# Patient Record
Sex: Female | Born: 1980 | Race: Black or African American | Hispanic: No | Marital: Single | Smoking: Never smoker
Health system: Southern US, Community
[De-identification: ages and names within clinical notes are randomized; demographics above are authoritative.]

## PROBLEM LIST (undated history)

## (undated) DIAGNOSIS — G4710 Hypersomnia, unspecified: Secondary | ICD-10-CM

## (undated) DIAGNOSIS — G2581 Restless legs syndrome: Secondary | ICD-10-CM

## (undated) DIAGNOSIS — G47419 Narcolepsy without cataplexy: Secondary | ICD-10-CM

## (undated) HISTORY — DX: Narcolepsy without cataplexy: G47.419

## (undated) HISTORY — DX: Hypersomnia, unspecified: G47.10

## (undated) HISTORY — DX: Restless legs syndrome: G25.81

## (undated) HISTORY — PX: NO PAST SURGERIES: SHX2092

---
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
## 2002-06-21 ENCOUNTER — Emergency Department (HOSPITAL_COMMUNITY): Admission: EM | Admit: 2002-06-21 | Discharge: 2002-06-21 | Payer: Self-pay | Admitting: Emergency Medicine

## 2003-08-25 ENCOUNTER — Ambulatory Visit (HOSPITAL_COMMUNITY): Admission: RE | Admit: 2003-08-25 | Discharge: 2003-08-25 | Payer: Self-pay | Admitting: *Deleted

## 2003-10-16 ENCOUNTER — Inpatient Hospital Stay (HOSPITAL_COMMUNITY): Admission: AD | Admit: 2003-10-16 | Discharge: 2003-10-16 | Payer: Self-pay | Admitting: Family Medicine

## 2003-10-16 ENCOUNTER — Encounter: Payer: Self-pay | Admitting: Emergency Medicine

## 2004-01-26 ENCOUNTER — Ambulatory Visit: Payer: Self-pay | Admitting: Family Medicine

## 2004-01-30 ENCOUNTER — Ambulatory Visit: Payer: Self-pay | Admitting: Family Medicine

## 2004-01-30 ENCOUNTER — Inpatient Hospital Stay (HOSPITAL_COMMUNITY): Admission: AD | Admit: 2004-01-30 | Discharge: 2004-02-03 | Payer: Self-pay | Admitting: *Deleted

## 2004-01-30 ENCOUNTER — Ambulatory Visit: Payer: Self-pay | Admitting: *Deleted

## 2004-02-01 ENCOUNTER — Ambulatory Visit: Payer: Self-pay | Admitting: Obstetrics and Gynecology

## 2006-01-21 ENCOUNTER — Ambulatory Visit (HOSPITAL_BASED_OUTPATIENT_CLINIC_OR_DEPARTMENT_OTHER): Admission: RE | Admit: 2006-01-21 | Discharge: 2006-01-21 | Payer: Self-pay | Admitting: Internal Medicine

## 2006-01-21 ENCOUNTER — Encounter: Payer: Self-pay | Admitting: Pulmonary Disease

## 2006-01-25 ENCOUNTER — Ambulatory Visit: Payer: Self-pay | Admitting: Internal Medicine

## 2006-04-15 ENCOUNTER — Ambulatory Visit: Payer: Self-pay | Admitting: Pulmonary Disease

## 2007-04-20 ENCOUNTER — Emergency Department (HOSPITAL_COMMUNITY): Admission: EM | Admit: 2007-04-20 | Discharge: 2007-04-20 | Payer: Self-pay | Admitting: Family Medicine

## 2008-01-27 ENCOUNTER — Emergency Department (HOSPITAL_COMMUNITY): Admission: EM | Admit: 2008-01-27 | Discharge: 2008-01-27 | Payer: Self-pay | Admitting: Family Medicine

## 2008-03-26 ENCOUNTER — Emergency Department (HOSPITAL_COMMUNITY): Admission: EM | Admit: 2008-03-26 | Discharge: 2008-03-26 | Payer: Self-pay | Admitting: Emergency Medicine

## 2008-12-28 ENCOUNTER — Ambulatory Visit: Payer: Self-pay | Admitting: Pulmonary Disease

## 2008-12-28 DIAGNOSIS — G4710 Hypersomnia, unspecified: Secondary | ICD-10-CM | POA: Insufficient documentation

## 2009-01-02 ENCOUNTER — Telehealth (INDEPENDENT_AMBULATORY_CARE_PROVIDER_SITE_OTHER): Payer: Self-pay | Admitting: *Deleted

## 2009-01-08 ENCOUNTER — Telehealth: Payer: Self-pay | Admitting: Pulmonary Disease

## 2009-01-30 ENCOUNTER — Encounter: Payer: Self-pay | Admitting: Pulmonary Disease

## 2009-01-30 ENCOUNTER — Ambulatory Visit (HOSPITAL_BASED_OUTPATIENT_CLINIC_OR_DEPARTMENT_OTHER): Admission: RE | Admit: 2009-01-30 | Discharge: 2009-01-30 | Payer: Self-pay | Admitting: Pulmonary Disease

## 2009-02-14 ENCOUNTER — Ambulatory Visit: Payer: Self-pay | Admitting: Pulmonary Disease

## 2009-02-14 ENCOUNTER — Telehealth (INDEPENDENT_AMBULATORY_CARE_PROVIDER_SITE_OTHER): Payer: Self-pay | Admitting: *Deleted

## 2009-02-15 ENCOUNTER — Ambulatory Visit: Payer: Self-pay | Admitting: Pulmonary Disease

## 2009-02-15 DIAGNOSIS — G47419 Narcolepsy without cataplexy: Secondary | ICD-10-CM | POA: Insufficient documentation

## 2009-02-15 DIAGNOSIS — G2581 Restless legs syndrome: Secondary | ICD-10-CM

## 2009-02-28 ENCOUNTER — Telehealth: Payer: Self-pay | Admitting: Pulmonary Disease

## 2009-03-01 ENCOUNTER — Telehealth: Payer: Self-pay | Admitting: Pulmonary Disease

## 2009-03-13 ENCOUNTER — Ambulatory Visit: Payer: Self-pay | Admitting: Pulmonary Disease

## 2009-05-01 ENCOUNTER — Ambulatory Visit: Payer: Self-pay | Admitting: Pulmonary Disease

## 2009-08-21 ENCOUNTER — Telehealth (INDEPENDENT_AMBULATORY_CARE_PROVIDER_SITE_OTHER): Payer: Self-pay | Admitting: *Deleted

## 2009-08-22 ENCOUNTER — Encounter: Payer: Self-pay | Admitting: Pulmonary Disease

## 2009-08-30 ENCOUNTER — Encounter: Payer: Self-pay | Admitting: Pulmonary Disease

## 2009-09-12 ENCOUNTER — Ambulatory Visit: Payer: Self-pay | Admitting: Pulmonary Disease

## 2009-09-14 ENCOUNTER — Telehealth (INDEPENDENT_AMBULATORY_CARE_PROVIDER_SITE_OTHER): Payer: Self-pay | Admitting: *Deleted

## 2009-09-24 ENCOUNTER — Encounter: Payer: Self-pay | Admitting: Pulmonary Disease

## 2010-02-05 NOTE — Progress Notes (Signed)
Summary: rx side effects     Phone Note Call from Patient Call back at [facility] Phone [phone]   Caller: Patient Call For: Theresa Foster Summary of Call: pt has cut her rx of ropinirole in 1/2 and she is still vomiting. (see previous emr msg). pt wants a rx for nausea. pt also states that she is having her wisdom teeth extracted tomorrow. she will be on pain meds and wants to know if there will be any problems re: taking all her meds.  Initial call taken by: Tivis Ringer,  January 08, 2009 9:52 AM  Follow-up for Phone Call        Pt has cut requip in half and she is still vomiting so she is requesting something for nausea. Also, the pt is scheduled to have her wisdom teeth extracted and she will be taking pain medicatio and she wants to make sure this will not interfere with any of her other medications. Please advise. Allergies: NKDA.   Carron Curie CMA  January 08, 2009 1:57 PM   Additional Follow-up for Phone Call Additional follow up Details #1::        If ropinerole is causing nausea, she should stop it until she can discuss with Dr Shelle Iron.     Additional Follow-up by: Waymon Budge MD,  January 08, 2009 5:49 PM    Additional Follow-up for Phone Call Additional follow up Details #2::    agree with discontinuing requip completely.  With everything going on, we should reschedule her MSLT study for later in the month...we really need to have an accurate study when this is done.  will send an order to schedule for 2 weeks later...someone will call her....make sure she fills out sleep diaries 2 weeks prior to the study Follow-up by: Barbaraann Share MD,  January 08, 2009 5:57 PM  Additional Follow-up for Phone Call Additional follow up Details #3:: Details for Additional Follow-up Action Taken: [facility]  Arman Filter LPN  January 09, 2009 9:58 AM  pt advised to stop requip, and to do sleep diary 2 weeks prior to sleep study. Study set fo r1/25/10. pt aware. Carron Curie CMA   January 10, 2009 10:34 AM

## 2010-02-05 NOTE — Progress Notes (Signed)
Summary: rx  Phone Note Call from Patient Call back at Home Phone [phone]   Caller: Patient Call For: Gigi Onstad Reason for Call: Talk to Nurse Summary of Call: pharmacy called pt and said insurance said no to her Nuvigil 250mg .  Please advise Initial call taken by: Eugene Gavia,  March 01, 2009 10:28 AM  Follow-up for Phone Call        called  medicaid PA line at [phone] and received PA approval on nuvigil 250mg  once daily though 03-01-2010. I advised pharmacy of this. pt aware as well. Carron Curie CMA  March 01, 2009 11:17 AM

## 2010-02-05 NOTE — Assessment & Plan Note (Signed)
Summary: rov for narcolepsy, RLS   Primary Provider/Referring Provider:  Avbuere  CC:  Pt is here for a f/u appt.  Pt states her sleep is "ok" while on Mirapex.  Pt would like to discuss dosage of Nuvigil.  States its too much- feels heart racing during the daytime even at rest. .  History of Present Illness: the pt comes in today for f/u of her known narcolepsy and RLS.  She is taking mirapex with good control of her leg symptoms.  She is taking nuvigil for her daytime sleepiness, and although it keeps her functional, she feels she did just as well on the lower dose without side effects.  She would like to go back to lower dose.  She denies poor sleep hygiene.  Current Medications (verified): 1)  Mirapex 0.125 Mg  Tabs (Pramipexole Dihydrochloride) .... Take 1 Tab By Mouth At Bedtime 2)  Nuvigil 250 Mg Tabs (Armodafinil) .... Take 1 By Mouth Once Daily  Allergies (verified): No Known Drug Allergies  Review of Systems       The patient complains of shortness of breath at rest, non-productive cough, nasal congestion/difficulty breathing through nose, and sneezing.  The patient denies shortness of breath with activity, productive cough, coughing up blood, chest pain, irregular heartbeats, acid heartburn, indigestion, loss of appetite, weight change, abdominal pain, difficulty swallowing, sore throat, tooth/dental problems, headaches, itching, ear ache, anxiety, depression, hand/feet swelling, joint stiffness or pain, rash, change in color of mucus, and fever.    Vital Signs:  Patient profile:   30 year old female Height:      64 inches Weight:      134 pounds BMI:     23.08 O2 Sat:      100 % on Room air Temp:     99.4 degrees F oral Pulse rate:   90 / minute BP sitting:   120 / 80  (left arm) Cuff size:   regular  Vitals Entered By: Arman Filter LPN (September 12, 2009 9:00 AM)  O2 Flow:  Room air CC: Pt is here for a f/u appt.  Pt states her sleep is "ok" while on Mirapex.  Pt  would like to discuss dosage of Nuvigil.  States its too much- feels heart racing during the daytime even at rest.  Comments Medications reviewed with patient Arman Filter LPN  September 12, 2009 9:03 AM    Physical Exam  General:  wd female in nad Nose:  no obvious purulence or discharge. Extremities:  no edema or cyanosis  Neurologic:  alert, does not appear sleepy, moves all 4.   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  Will go ahead and change her prescription.  I have again stressed to her the need to get 8hrs of sleep a night, and to take naps and/or modest intake of caffeine during the day.  Problem # 2:  RESTLESS LEGS SYNDROME (ICD-333.94) she is to continue on her dopamine agonist  Medications Added to Medication List This Visit: 1)  Mirapex 0.125 Mg Tabs (Pramipexole dihydrochloride) .... Take 1 tab by mouth at bedtime 2)  Nuvigil 150 Mg Tabs (Armodafinil) .... One each am, and can take additional dose at 1pm if needed.  Other Orders: Est. Patient Level III (57846)  Patient Instructions: 1)  continue on mirapex 2)  continue working on good sleep hygiene, naps during  the day as needed 3)  will change nuvigil to the 150mg  dose each am, and can take an extra dose at 1pm if needed for busy days. 4)  followup with me in 6mos  Prescriptions: NUVIGIL 150 MG TABS (ARMODAFINIL) one each am, and can take additional dose at 1pm if needed.  #60 x 6   Entered and Authorized by:   Barbaraann Share MD   Signed by:   Barbaraann Share MD on 09/12/2009   Method used:   Print then Give to Patient   RxID:   [id number]

## 2010-02-05 NOTE — Progress Notes (Signed)
Summary: Prior Auth - Nuvigil 150mg ---APPROVED  Phone Note Outgoing Call   Call placed by: Gweneth Dimitri RN,  September 14, 2009 10:17 AM Summary of Call: Received request from [facility] to initiate prior auth for Nuvigil 150mg .  Called [facility] to initiate this.  Awaiting fax to triage fax #.   Initial call taken by: Gweneth Dimitri RN,  September 14, 2009 10:19 AM  Follow-up for Phone Call        Fax received and placed in [facility] very important folder.  Gweneth Dimitri RN  September 14, 2009 10:26 AM  PA form still in [facility] lookat, not filled out.  form placed on top of stack.  [facility]'s nurse is aware and phone note sent to Megan's inbox. Boone Master CNA/MA  September 19, 2009 4:03 PM    Kc, just forwarding this message to you as an FYI.  The PA is in your very important look at folder for you to fill out.  Arman Filter LPN  September 20, 2009 2:46 PM    Additional Follow-up for Phone Call Additional follow up Details #1::        Form completed by [facility] and faxed back. Gweneth Dimitri RN  September 20, 2009 5:43 PM  APPROVED from 09/14/2009 ro 09/14/2010.Marland Kitchen Form will be given to [place] or Shawna Orleans to scan and pharmacy and pt will be notified.Michel Bickers [facility]  September 24, 2009 9:35 AM

## 2010-02-05 NOTE — Assessment & Plan Note (Signed)
Summary: rov for narcolepsy and rls   CC:  4 wk follow up.  states the nuvigil 250mg  qd is working.  states it does keep her awake during the day until about 4pm and then she will start to get tired.  States she does believe the 250mg  is working better than the 150mg .   Would like to discuss side effects of nuvigil-had read that depresion is a side effect and has concerns regarding this. .  History of Present Illness: the pt comes in today for f/u of her narcolepsy and RLS.  She has done better since starting on nuvigil and mirapex.  She is much more functional during the day, and her leg movements are improved as well.  She is trying to stick to good sleep hygiene.  Current Medications (verified): 1)  Birth Control Pill .... Take As Directed 2)  Mirapex 0.125 Mg  Tabs (Pramipexole Dihydrochloride) .... One By Mouth At Bedtime For First Week, Then Increase To 2 At [facility] If Tolerating. 3)  Nuvigil 250 Mg Tabs (Armodafinil) .... Take 1 Tablet By Mouth Once A Day  Allergies (verified): No Known Drug Allergies  Review of Systems      See HPI  Vital Signs:  Patient profile:   30 year old female Height:      64 inches Weight:      139.25 pounds BMI:     23.99 O2 Sat:      100 % on Room air Temp:     98.4 degrees F oral Pulse rate:   110 / minute BP sitting:   124 / 80  (right arm) Cuff size:   regular  Vitals Entered By: Gweneth Dimitri RN (March 13, 2009 9:07 AM)  O2 Flow:  Room air CC: 4 wk follow up.  states the nuvigil 250mg  qd is working.  states it does keep her awake during the day until about 4pm and then she will start to get tired.  States she does believe the 250mg  is working better than the 150mg .   Would like to discuss side effects of nuvigil-had read that depresion is a side effect and has concerns regarding this.  Comments Medications reviewed with patient Daytime contact number verified with patient. 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  She is able to function during the day, and has not had any side effects from the medication.  I have reminded her to continue to work on getting a good nights sleep, use of naps and caffeine.  Time spent with pt today was .  Problem # 2:  RESTLESS LEGS SYNDROME (ICD-333.94)  the pt is doing much better with mirapex.  She has missed a few doses, and clearly sees a difference when she takes it.  Other Orders: Est. Patient Level III (06237)  Patient Instructions: 1)  continue on nuvigil and mirapex 2)  continue to work on good sleep habits, naps when able, and caffeine in moderate consumption to help with symptoms. 3)  followup with me in 6mos.

## 2010-02-05 NOTE — Assessment & Plan Note (Signed)
Summary: ov to discuss mslt results.   CC:  Pt is here for a f/u appt to discuss MSLT results. .  History of Present Illness: The pt comes in today for f/u of her sleeping issues.  She was not able to tolerate requip due to nausea, but did notice that it helped her leg movements/sensations.  She has had her mslt, which was very abnormal.  It showed a mean sleep latency of 1.29min, and achieved REM in all the naps.  She did not bring sleep logs with her, but she tells me that she has made a point to get to bed nightly by 10pm, and feels she gets at least 7hrs of sleep a night.  I have gone over her study with her in detail, and answered all of her questions.  Current Medications (verified): 1)  Birth Control Pill .... Take As Directed  Allergies (verified): No Known Drug Allergies  Vital Signs:  Patient profile:   30 year old female Height:      64 inches Weight:      140.13 pounds BMI:     24.14 O2 Sat:      99 % on Room air Temp:     98.2 degrees F oral Pulse rate:   83 / minute BP sitting:   122 / 80  (left arm) Cuff size:   regular  Vitals Entered By: Arman Filter LPN (February 15, 2009 11:29 AM)  O2 Flow:  Room air CC: Pt is here for a f/u appt to discuss MSLT results.  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  I have had a long discussion with her about narcolepsy, and how we can never totally eliminate her daytime sleepiness.  Treatment will consist of addressing nocturnal issues such as RLS, working on good sleep hygeine, the use of naps during the day if able, use of caffeine in reasonable quantities, and finally the use of stimulant medication.  I would like  to start with nuvigil, and reminded her of his interaction with BCP's.  Time spent with pt today was .  Problem # 2:  RESTLESS LEGS SYNDROME (ICD-333.94) I do believe she has this, and will change requip to mirapex to see if she tolerates better.  Medications Added to Medication List This Visit: 1)  Mirapex 0.125 Mg Tabs (Pramipexole dihydrochloride) .... One by mouth at bedtime for first week, then increase to 2 at hs if tolerating. 2)  Nuvigil 150 Mg Tabs (Armodafinil) .... One each am, and may repeat dose in afternoon if needed. 3)  Nuvigil 150 Mg Tabs (Armodafinil) .... One each am, and can repeat in afternoon if needed.  Other Orders: Est. Patient Level IV (16109)  Patient Instructions: 1)  will change requip to mirapex 0.125mg  one after dinner each night.  If tolerating, increase to 2 after dinner each night 2)  start nuvigil 150mg  each am, and can take another dose at 1pm if having symptoms.  Do not forget the drug can interfere with birth control pills 3)  get at least 7 hours of sleep a night 4)  catnaps during day, moderate caffeine use to help with daytime symptoms. 5)  followup with me in 4 weeks.   Prescriptions: NUVIGIL 150 MG TABS (ARMODAFINIL) one each am, and can repeat in afternoon if needed.  #14 x 0   Entered and Authorized by:   Barbaraann Share MD   Signed by:   Barbaraann Share MD on 02/15/2009   Method used:   Print then Give to Patient   RxID:   [id number] NUVIGIL 150 MG TABS (ARMODAFINIL) one each am, and may repeat dose in afternoon if needed.  #60 x 6   Entered and Authorized by:   Barbaraann Share MD   Signed by:   Barbaraann Share MD on 02/15/2009   Method used:   Print then Give to Patient   RxID:   [id number] MIRAPEX 0.125 MG  TABS (PRAMIPEXOLE DIHYDROCHLORIDE) one By mouth at bedtime for first week, then increase to 2 at HS if tolerating.  #60 x 6   Entered and Authorized by:   Barbaraann Share MD   Signed by:   Barbaraann Share MD on  02/15/2009   Method used:   Print then Give to Patient   RxID:   [id number]    Immunization History:  Influenza Immunization History:    Influenza:  n/a (02/15/2009)  Pneumovax Immunization History:    Pneumovax:  n/a (02/15/2009)

## 2010-02-05 NOTE — Progress Notes (Signed)
----   Converted from flag ---- ---- 02/14/2009 9:37 AM, Arman Filter LPN wrote: pt needs ov with kc to discuss sleep study results. ------------------------------  called and spoke with pt.  pt scheduled to see kc tomorrow 02-15-2009 at 11:45am

## 2010-02-05 NOTE — Assessment & Plan Note (Signed)
Summary: rov for narcolepsy and RLS   Primary Provider/Referring Provider:  Avbuere  CC:  Narcolepsy and RLS follow-up.  The patient says the Mirapex is working well for her but she stopped the Nuvigil 3-4 days ago due to rapid heart rate.Marland Kitchen  History of Present Illness: the pt comes in today for f/u of her RLS and narcolepsy.  She has been doing well with mirapex, and feels it helps quite a bit.  However, she has been having palpitations, and thinks it is related to her nuvigil.  She has since stopped this med, and now has significant daytime sleepiness.  Current Medications (verified): 1)  Birth Control Pill .... Take As Directed 2)  Mirapex 0.125 Mg  Tabs (Pramipexole Dihydrochloride) .... One By Mouth At Bedtime For First Week, Then Increase To 2 At [facility] If Tolerating.  Allergies (verified): No Known Drug Allergies  Review of Systems      See HPI  Vital Signs:  Patient profile:   30 year old female Height:      64 inches (162.56 cm) Weight:      137 pounds (62.27 kg) BMI:     23.60 O2 Sat:      100 % on Room air Temp:     98.5 degrees F (36.94 degrees C) oral Pulse rate:   104 / minute BP sitting:   118 / 76  (left arm) Cuff size:   regular  Vitals Entered By: Michel Bickers CMA (May 01, 2009 8:55 AM)  O2 Sat at Rest %:  100 O2 Flow:  Room air CC: Narcolepsy and RLS follow-up.  The patient says the Mirapex is working well for her but she stopped the Nuvigil 3-4 days ago due to rapid heart rate. Comments Medications reviewed with the patient. Daytime phone number verified. Michel Bickers CMA  May 01, 2009 8:57 AM   Physical Exam  General:  wd female in nad Neurologic:  awake, does not appear sleepy moves all 4.   Impression & Recommendations:  Problem # 1:  RESTLESS LEGS SYNDROME (ICD-333.94)  doing much better on mirapex, with no disruption of sleep from leg jerks or abnormal sensations.  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  I have explained to pt that nuvigil has been studied extensively, and has no significant effects on the CV system.  I have asked her not to assume this is the cause of her palpitations, and to make an apptm with her primary md to look at other causes.  I have also explained to her the other options for treatment have extensive cardiovascular risks.  Time spent with pt today was  Other Orders: Est. Patient Level III (11914)  Patient Instructions: 1)  stay on mirapex 2)  would get back on nuvigil, and make apptm with your primary md to look at other causes of palpitations. 3)  follow up with me in 6mos.

## 2010-02-05 NOTE — Progress Notes (Signed)
Summary: nuvigil  Phone Note Call from Patient Call back at [facility] Phone [phone]   Caller: Patient Call For: [facility] Summary of Call: PT NEEDS RX FOR NUVIGIL BUT WAS TOLD THAT SHE NEEDS PREAUTH (DUE TO MEDICAID COVERAGE). IN THE MEANTIME, PT ONLY HAS 1 WEEKS WORTH OF NUVIGIL SAMPLE LEFT.    Initial call taken by: Tivis Ringer, CNA,  February 28, 2009 3:23 PM  Follow-up for Phone Call        Called to get PA.  Was informed that we can not approve med because directions say to take 1 in the am and 1 in the afternoon if needed.  They can approve the 250 mg once daily or the 150 mg strength to be taken only once a day.  Please advise, thanks Vernie Murders  February 28, 2009 3:41 PM      Additional Follow-up for Phone Call Additional follow up Details #1::        let pt know this info....would use 250 mg once daily and see how things go. Additional Follow-up by: Barbaraann Share MD,  February 28, 2009 5:26 PM    Additional Follow-up for Phone Call Additional follow up Details #2::    pt states she will try 250mg  once daily. I have sent rx to pharmacy.Carron Curie CMA  March 01, 2009 9:26 AM   New/Updated Medications: NUVIGIL 250 MG TABS (ARMODAFINIL) Take 1 tablet by mouth once a day Prescriptions: NUVIGIL 250 MG TABS (ARMODAFINIL) Take 1 tablet by mouth once a day  #30 x 2   Entered by:   Carron Curie CMA   Authorized by:   Barbaraann Share MD   Signed by:   Carron Curie CMA on 03/01/2009   Method used:   Telephoned to ...       [facility] [address] [phone]* (retail)       [address]       [place], [place]  [address]       Ph: [phone]       Fax: [phone]   RxID:   [id number]

## 2010-02-05 NOTE — Letter (Signed)
Summary: RELEASE OF INFO  RELEASE OF INFO   Imported By: Lehman Prom 08/30/2009 14:18:04  _____________________________________________________________________  External Attachment:    Type:   Image     Comment:   External Document

## 2010-02-05 NOTE — Progress Notes (Signed)
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 fax# [phone].  Initial call taken by: Tivis Ringer, CNA,  August 21, 2009 11:07 AM  Follow-up for Phone Call        called and spoke with pt.  pt states she needs a letter for her school from Dr. Shelle Iron stating what he treats her for.  pt states she needs this letter today.  Pt states she will come back today when letter is ready to be picked up and will sign a release of info at that time allowing [place] to give this medical information to her school, [employer].  Arman Filter LPN  August 21, 2009 11:19 AM   pt called back again today requesting note.  i informed pt I took the message and gave to Dr. Shelle Iron.  However, our protocol is to allow 24 to 48 hours for a letter.  pt got very upset stating she needed letter asap.  i informed pt Dr. Shelle Iron is out of the office this afternoon and will return to the office tomorrow.  I instructed pt to wait until we call her letting her know letter is ready before coming to the office to pick up.  pt verbalized understanding.  Arman Filter LPN  August 21, 2009 2:44 PM   Additional Follow-up for Phone Call Additional follow up Details #1::        megan, I really need to know what the letter is for, and what it is to address. i.e.  why does she need it. Additional Follow-up by: Barbaraann Share MD,  August 21, 2009 3:09 PM    Additional Follow-up for Phone Call Additional follow up Details #2::    Spoke with patient-she states the letter needs to say she is a patient of yours under treatment for her conditions. Also, pt stated she is currently taking Nuvigil 250mg  once daily and has been on this since the spring-I update her med list to reflect  this. Thanks. Reynaldo Minium CMA  August 21, 2009 3:17 PM   Additional Follow-up for Phone Call Additional follow up Details #3:: Details for Additional Follow-up Action Taken: please make sure this pt knows that it interferes with BCP's, and should consider other contraceptive methods if she is taking bcp's and doesn't want to risk pregnancy.  let her know that Aundra Millet has her letter. Additional Follow-up by: Barbaraann Share MD,  August 22, 2009 5:39 PM  New/Updated Medications: NUVIGIL 250 MG TABS (ARMODAFINIL) take 1 by mouth once daily   Pt is aware that letter is at front for pick up-will speak to her about the BCP issues when she gets letter.Reynaldo Minium CMA  August 23, 2009 9:17 AM    Pt picked up letter and is aware of BCP and Nuvigil recs from [facility].Reynaldo Minium CMA  August 23, 2009 9:50 AM

## 2010-02-05 NOTE — Letter (Signed)
Summary: School letter/[employer]  School letter/[employer]   Imported By: Lester Flowing [place] 08/29/2009 09:58:23  _____________________________________________________________________  External Attachment:    Type:   Image     Comment:   External Document

## 2010-02-05 NOTE — Medication Information (Signed)
Summary: Tax adviser   Imported By: Valinda Hoar 09/24/2009 11:06:52  _____________________________________________________________________  External Attachment:    Type:   Image     Comment:   External Document

## 2010-03-11 ENCOUNTER — Ambulatory Visit (INDEPENDENT_AMBULATORY_CARE_PROVIDER_SITE_OTHER): Payer: Medicaid Other | Admitting: Pulmonary Disease

## 2010-03-11 ENCOUNTER — Encounter: Payer: Self-pay | Admitting: Pulmonary Disease

## 2010-03-11 DIAGNOSIS — G47419 Narcolepsy without cataplexy: Secondary | ICD-10-CM

## 2010-03-11 DIAGNOSIS — G2581 Restless legs syndrome: Secondary | ICD-10-CM

## 2010-03-19 NOTE — Assessment & Plan Note (Signed)
Summary: rov for narcolepsy/RLS   Primary Provider/Referring Provider:  Concepcion Elk  CC:  6 month f/u appt for RLS and Narcolepsy.  Pt denied any complaints with RLS and being on Mirapex.  Pt denied any complaints with staying awake during the day. States she will occ have to take a second dose of Nuvigil if she knows she is going to have a busy day.  Marland Kitchen  History of Present Illness: the pt comes in today for f/u of her RLS and narcolepsy.  She is maintaining on a dopamine agonist and nuvigil, and feels she is doing well.  She is sticking to good sleep hygiene, and behaviors to minimize her sleepiness.    Medications Prior to Update: 1)  Mirapex 0.125 Mg  Tabs (Pramipexole Dihydrochloride) .... Take 1 Tab By Mouth At Bedtime 2)  Nuvigil 150 Mg Tabs (Armodafinil) .... One Each Am, and Can Take Additional Dose At 1pm If Needed.  Allergies (verified): No Known Drug Allergies  Review of Systems       The patient complains of shortness of breath with activity, shortness of breath at rest, productive cough, non-productive cough, chest pain, irregular heartbeats, sore throat, and headaches.  The patient denies coughing up blood, acid heartburn, indigestion, loss of appetite, weight change, abdominal pain, difficulty swallowing, tooth/dental problems, nasal congestion/difficulty breathing through nose, sneezing, itching, ear ache, anxiety, depression, hand/feet swelling, joint stiffness or pain, rash, change in color of mucus, and fever.    Vital Signs:  Patient profile:   30 year old female Height:      64 inches Weight:      142.50 pounds O2 Sat:      99 % on Room air Temp:     98.3 degrees F oral Pulse rate:   99 / minute BP sitting:   120 / 70  (left arm) Cuff size:   regular  O2 Flow:  Room air CC: 6 month f/u appt for RLS and Narcolepsy.  Pt denied any complaints with RLS and being on Mirapex.  Pt denied any complaints with staying awake during the day. States she will occ have to take a  second dose of Nuvigil if she knows she is going to have a busy day.     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  She is getting rest at night, and is working on taking short naps during day when able.    Problem # 2:  RESTLESS LEGS SYNDROME (ICD-333.94)  doing well with requip.  Other Orders: Est. Patient Level III (16109)  Patient Instructions: 1)  no change in meds 2)  continue to work on getting good sleep 3)  followup with me in 6mos

## 2010-04-22 LAB — WET PREP, GENITAL
Trich, Wet Prep: NONE SEEN
WBC, Wet Prep HPF POC: NONE SEEN
Yeast Wet Prep HPF POC: NONE SEEN

## 2010-04-22 LAB — POCT URINALYSIS DIP (DEVICE)
Bilirubin Urine: NEGATIVE
Hgb urine dipstick: NEGATIVE
Ketones, ur: NEGATIVE mg/dL
Specific Gravity, Urine: 1.02 (ref 1.005–1.030)
pH: 7 (ref 5.0–8.0)

## 2010-04-22 LAB — URINE CULTURE

## 2010-04-25 ENCOUNTER — Other Ambulatory Visit: Payer: Self-pay | Admitting: Pulmonary Disease

## 2010-05-01 ENCOUNTER — Other Ambulatory Visit: Payer: Self-pay | Admitting: *Deleted

## 2010-05-01 MED ORDER — PRAMIPEXOLE DIHYDROCHLORIDE 0.125 MG PO TABS: ORAL_TABLET | ORAL | Status: DC

## 2010-05-01 MED ORDER — ARMODAFINIL 150 MG PO TABS: ORAL_TABLET | ORAL | Status: DC

## 2010-05-16 IMAGING — CT CT MAXILLOFACIAL W/O CM
3 series · 16 of 47 positions shown, 19 images · non-contrast
Comparison: None.

CLINICAL DATA: Trauma, left maxilla and nasal pain

CT MAXILLOFACIAL WITHOUT CONTRAST
TECHNIQUE: Multidetector CT imaging of the maxillofacial
structures was performed. Multiplanar CT image reconstructions were
also generated.

[Series 4: orbit/facial 2.0 h30s st · axial · 0.31mm/px · z∈[-226,-84]mm · 10 of 83 slices shown, 13 images]
[im 6/83  brain]
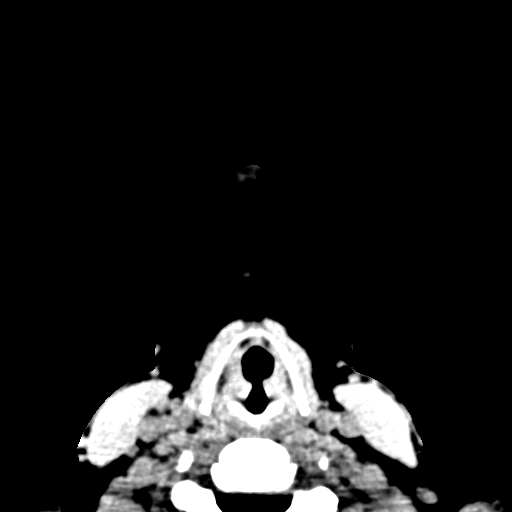
[im 6/83  bone]
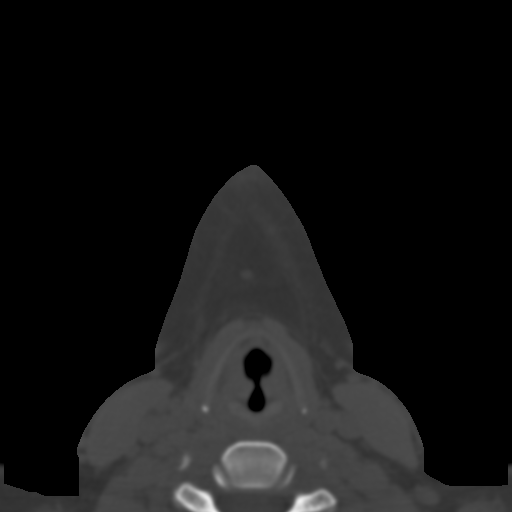
[im 15/83  bone]
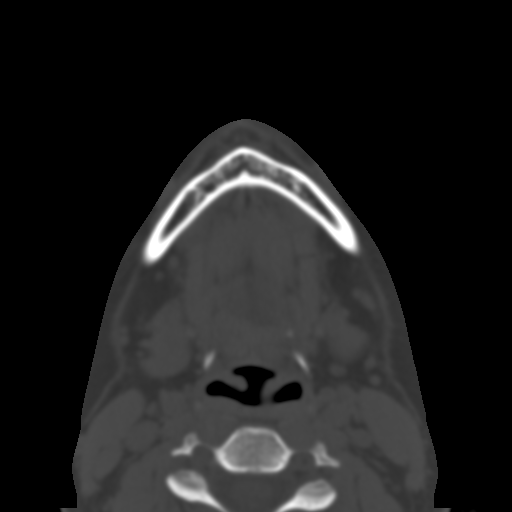
[im 23/83  bone]
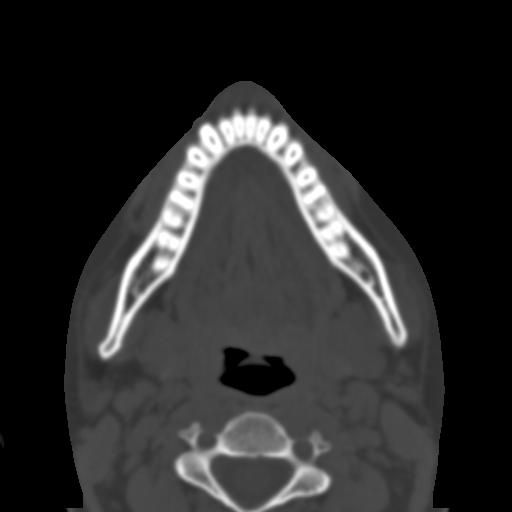
[im 29/83  bone]
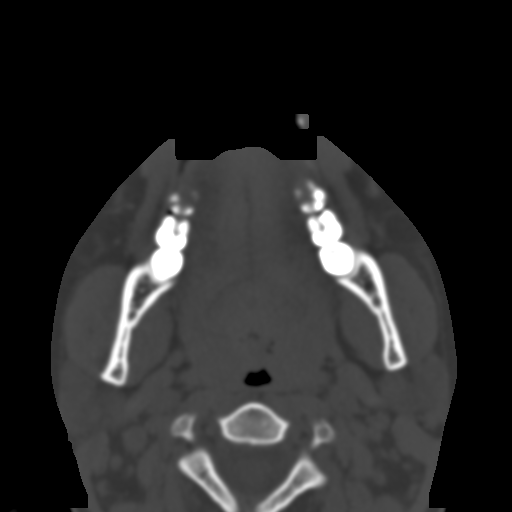
[im 37/83  brain]
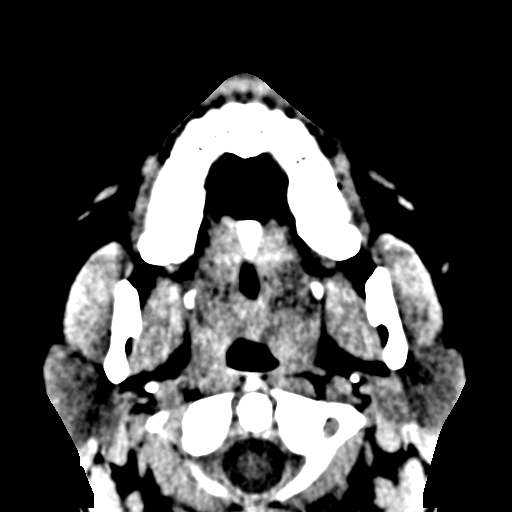
[im 37/83  bone]
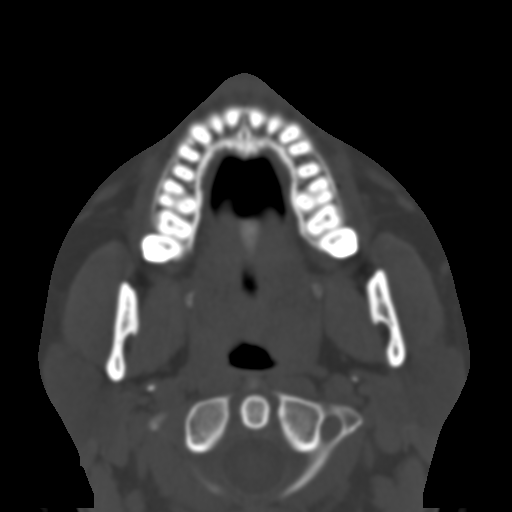
[im 46/83  bone]
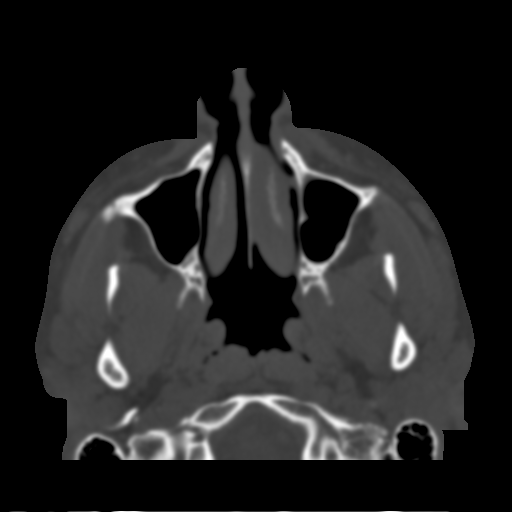
[im 54/83  bone]
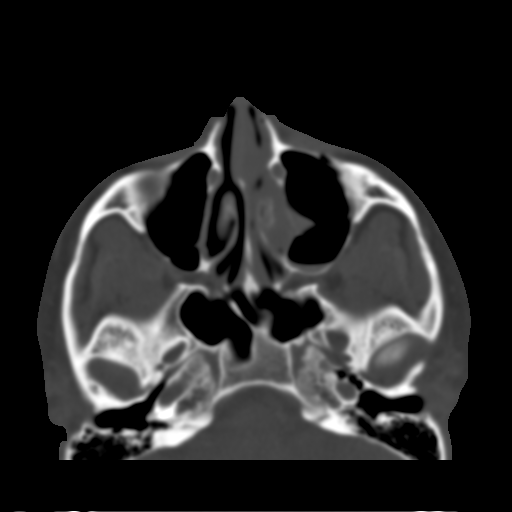
[im 63/83  bone]
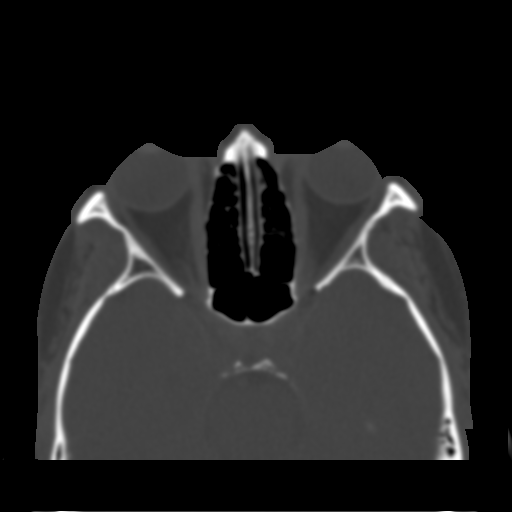
[im 68/83  brain]
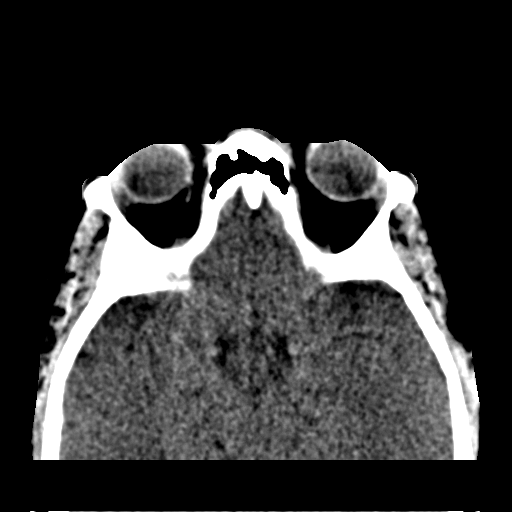
[im 68/83  bone]
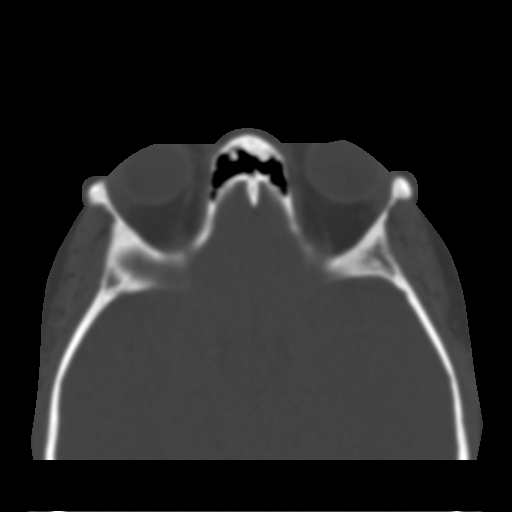
[im 77/83  bone]
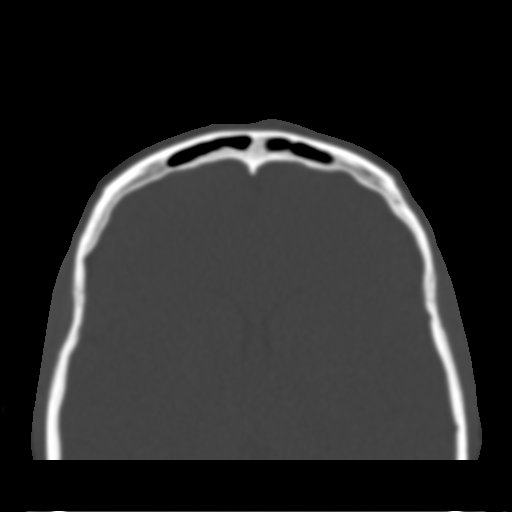

[Series 602: cor · coronal · 0.32mm/px · 3 of 66 slices shown]
[im 22/66  bone]
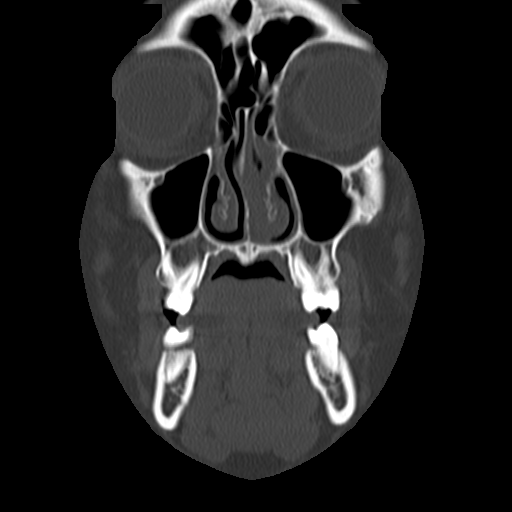
[im 29/66  bone]
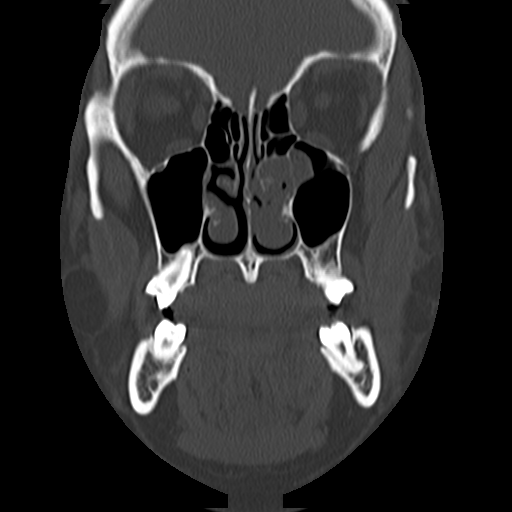
[im 37/66  bone]
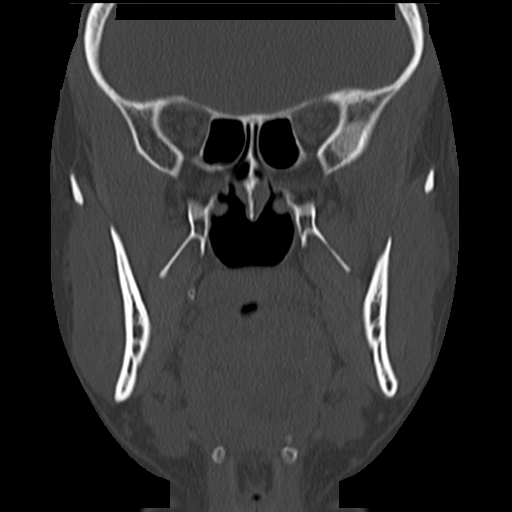

[Series 603: sag · sagittal · 0.32mm/px · 3 of 66 slices shown]
[im 22/66  bone]
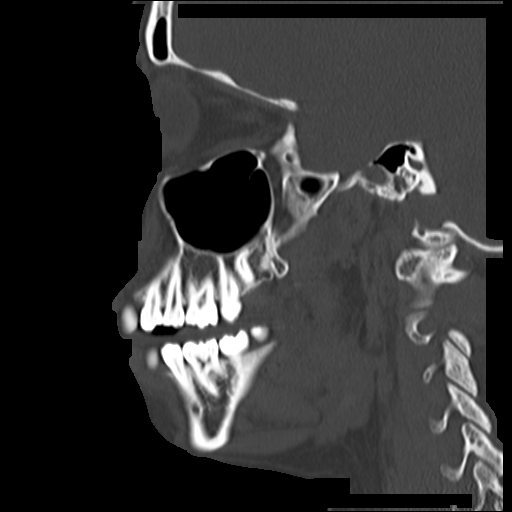
[im 33/66  bone]
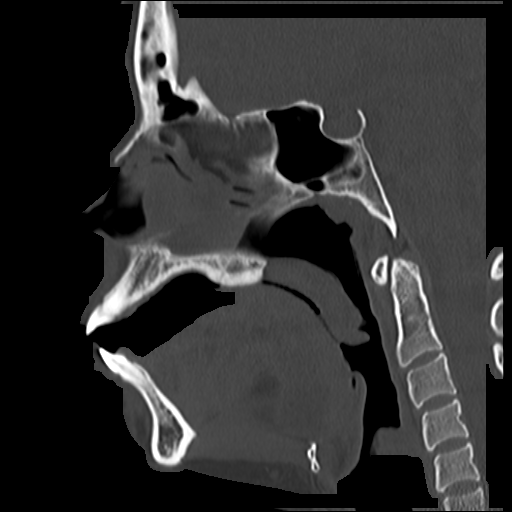
[im 44/66  bone]
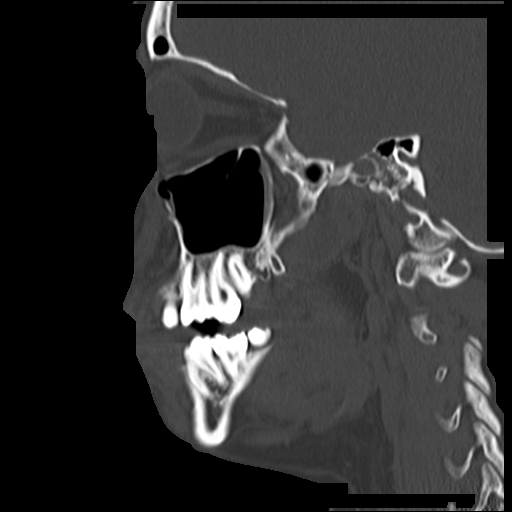

[16 of 47 positions shown; findings below may reference images not displayed]

FINDINGS: Minimally displaced fractures are present of the left
maxilla involving the medial wall and also the inferior orbital
wall.  Also suspected nondisplaced fracture of the left nasal
bones.  Hemorrhage is present in the left maxillary sinus with an
air-fluid level.  Mucosal thickening is present in the ethmoid
sinuses worse on the left.  No evidence of extraocular muscle
entrapment.  No proptosis.  Symmetric orbits.  No other fractures
visualized.  The mandible, right maxilla, zygomas, right orbit,
pterygoid plates, and hard palate appear intact.  Visualized
cervical spine unremarkable.
IMPRESSION: Left maxilla fracture involving the medial maxillary wall and
inferior orbital wall.
Suspect associated left nasal bone fracture.

## 2010-05-24 NOTE — Assessment & Plan Note (Signed)
[facility]                             PULMONARY OFFICE NOTE   NAME:Theresa Foster, Theresa Foster                        MRN:          [id number]  DATE:04/15/2006                            DOB:          1980-10-10    HISTORY OF PRESENT ILLNESS:  The patient is a 30 year old black female  who I have been asked to see for an unknown sleep disorder.  The patient  notes significant daytime sleepiness which started during her high  school years and has continued to be a problem.  She states back in high  school she would have sleepiness in the class to the point of dosing and  would also take naps frequently in the afternoon and evenings.  This all  occurred in the setting of excellent sleep hygiene.  The patient  recently was pregnancy and since that time, her symptoms have  accelerated.  She has very severe inappropriate daytime sleepiness and  states that she can fall asleep anytime she sits down.  The patient has  been tried on Provigil which seems to help in the morning but does not  help her in the afternoon.  She does not feel that she dreams with her  periods of dosing.  She denies any cataplexy, sleep paralysis, or  hypnagogic hallucinations.  The patient does snore but denies any kicks  during the night and denies symptoms consistent with restless leg  syndrome.  She typically goes to bed between 9 and 11 and gets up at 6  to start her day.  She typically feels rested in the mornings, although  over the last 1-2 months she has been a little more fatigued.  She has  undergone a sleep study on January 21, 2006 which showed small numbers  of apneas and hypopneas but respiratory disturbance index of only 2  events per hour.  She was found to have 118 leg jerks with 8.5 per hour  associated with awakening or arousal.  This is significant.  The patient  denies any neurologic symptoms, headaches, or significant visual  changes.  There is no history of this in family  members.   PAST MEDICAL HISTORY:  Totally unremarkable.   MEDICATIONS:  Provigil 200 mg daily which she has not taken in quite a  while.   ALLERGIES:  NO KNOWN DRUG ALLERGIES.   SOCIAL HISTORY:  She is single and does not smoke.  She works for [employer].   FAMILY HISTORY:  Remarkable for no significant medical issues in first-  degree relatives.   REVIEW OF SYSTEMS:  As per history of present illness.  I will see the  patient and take the form documented in the chart.   PHYSICAL EXAMINATION:  GENERAL:  She is a well-developed black female in  no acute distress.  VITAL SIGNS:  Blood pressure 108/70, pulse 71, temperature 98.3, weight  131 pounds, O2 saturation on room air is 100%.  HEENT:  Pupils equal, round, reactive to light and accommodation.  Extraocular muscles are intact.  Nares show mild septal deviation to the  left.  Oropharynx  shows very large tonsils with elongation of the soft  palate and uvula.  NECK:  Supple without JVD or lymphadenopathy.  There is no palpable  thyromegaly.  CHEST:  Totally clear.  CARDIAC:  Regular rate and rhythm.  No murmurs, rubs, or gallops.  ABDOMEN:  Soft, nontender, with good bowel sounds.  GU:  Not done and not indicated.  RECTAL:  Not done and not indicated.  BREASTS:  Not done and not indicated.  EXTREMITIES:  Lower extremities are without edema.  Pulses are intact  distally.  NEUROLOGIC:  Alert and oriented with no evidence of motor deficits.   IMPRESSION:  Very severe inappropriate daytime sleepiness of unknown  etiology.  The patient seems to have excellent sleep hygiene, and her  sleep study did not show significant sleep disorder breathing.  However,  her upper airway anatomy is extremely abnormal, with large tonsils and  uvula and palate, and she does snore.  This would raise the question of  the upper airway resistance syndrome.  The patient was also noted to  have very large numbers of leg jerks with significant sleep  disruption,  and this may be her primary issue.  I really think at this point in time  that we should treat her leg jerks empirically with a dopamine agonist  to see if that will improve her sleep and daytime symptoms.  We will  also give consideration to the upper airway resistance syndrome.  If the  patient fails to respond in the normal fashion, I think that she would  benefit from a multiple sleep latency study to consider narcolepsy or  idiopathic hypersomnia.  The patient is agreeable to this approach.   PLAN:  1. Trial of Requip in an escalating fashion up to 1 mg q.h.s.  2. The patient will follow up in 3-4 weeks or sooner if there are      problems.     Barbaraann Share, MD,FCCP  Electronically Signed    [facility]Med[facility]  DD: 04/16/2006  DT: 04/16/2006  Job #: [id number]   cc:   Fleet Contras, M.D.

## 2010-05-24 NOTE — Procedures (Signed)
Theresa Foster, Theresa Foster               ACCOUNT NO.:  [id number]   MEDICAL RECORD NO.:  [id number]          PATIENT TYPE:  OUT   LOCATION:  SLEEP CENTER                 FACILITY:  [facility]   PHYSICIAN:  Clinton D. Maple Hudson, MD, FCCP, FACPDATE OF BIRTH:  12/02/1982   DATE OF STUDY:  01/21/2006                            NOCTURNAL POLYSOMNOGRAM   REFERRING PHYSICIAN:  Fleet Contras MD   INDICATION FOR STUDY:  Hypersomnia with sleep apnea.   EPWORTH SLEEPINESS SCORE:  18/24, weight 128 pounds.   HOME MEDICATION:  Provigil.   SLEEP ARCHITECTURE:  Total sleep time 275 minutes with sleep deficiency  77%.  Stage I was 7%, stage II 79%, stages III and IV 4%, REM 10% of  total sleep time.  Sleep latency 1 minute, REM latency 4.5 minutes,  awake after sleep onset 83 minutes, arousal index 37.2.  There were  frequent brief wakings throughout the study night.  No bedtime  medication was taken.   RESPIRATORY DATA:  Apnea hypopnea index (AHI, RDI) 2.4 obstructive  events per hour, which is within normal limits (normal range 0-5 per  hour).  There were 5 obstructive apneas and 6 hypopneas.  Events were  associated with sleeping supine and on right side.  REM AHI 6.8.   OXYGEN DATA:  Mild to moderate snoring with oxygen saturation to a nadir  of 92%.  Mean oxygen saturation through the study was 98% on room air.   CARDIAC DATA:  Normal sinus rhythm.   MOVEMENT-PARASOMNIA:  A total of 118 limb jerks, of which 39 were  associated with arousal or awakening, for a periodic limb movement with  arousal index of 8.5 per hour, which is increased.  She slept with her  glasses on, which she says is routine.   IMPRESSIONS-RECOMMENDATIONS:  1. Short total sleep time with frequent nonspecific brief wakings      through the night, reduced percentage time rapid eye movement.  2. Occasional sleep disordered breathing events, apnea-hypopnea index      2.4 per hour (normal range 0-5 per hour) with non-positional  events, mild to moderate snoring and oxygen desaturation to a nadir      of 92%.  3. Periodic limb movement syndrome with arousal, 8.5 times per hour.      This is sufficient to contribute some to waking and to daytime      tiredness.  It's significance could be tested with a therapeutic      trial of Requip or Mirapex as appropriate, or possibly clonazepam.  4. Consider possibility of narcolepsy syndrome based on the history of      significant daytime sleepiness since high school without      substantial obstructive apnea to explain poor sleep quality.      Clinton D. Maple Hudson, MD, [facility], FACP  Diplomate, Biomedical engineer of Sleep Medicine  Electronically Signed     CDY/MEDQ  D:  01/25/2006 13:01:07  T:  01/25/2006 18:18:12  Job:  [id number]

## 2010-09-10 ENCOUNTER — Encounter: Payer: Self-pay | Admitting: Pulmonary Disease

## 2010-09-11 ENCOUNTER — Ambulatory Visit: Payer: Self-pay | Admitting: Pulmonary Disease

## 2010-12-26 ENCOUNTER — Encounter: Payer: Self-pay | Admitting: Pulmonary Disease

## 2010-12-27 ENCOUNTER — Encounter: Payer: Self-pay | Admitting: Pulmonary Disease

## 2010-12-27 ENCOUNTER — Ambulatory Visit (INDEPENDENT_AMBULATORY_CARE_PROVIDER_SITE_OTHER): Payer: Medicaid Other | Admitting: Pulmonary Disease

## 2010-12-27 DIAGNOSIS — G47419 Narcolepsy without cataplexy: Secondary | ICD-10-CM

## 2010-12-27 DIAGNOSIS — G2581 Restless legs syndrome: Secondary | ICD-10-CM

## 2010-12-27 DIAGNOSIS — G4700 Insomnia, unspecified: Secondary | ICD-10-CM

## 2010-12-27 MED ORDER — ARMODAFINIL 150 MG PO TABS: ORAL_TABLET | ORAL | Status: DC

## 2010-12-27 MED ORDER — PRAMIPEXOLE DIHYDROCHLORIDE 0.125 MG PO TABS: ORAL_TABLET | ORAL | Status: DC

## 2010-12-27 NOTE — Patient Instructions (Signed)
Continue current medications Work on sleep hygiene: 1) take 30-60 min each night to unwind before going to bed 2) no reading or tv while in bed.  No computer after 10pm 3) do not stay in bed more than if you cannot initiate sleep.  Get out of bed and go to family room to watch tv or read.  No eating/drinking/computer/activities.  4) stay out of bedroom during the day unless you are going to take a nap  followup with me in 6mos.

## 2010-12-27 NOTE — Assessment & Plan Note (Signed)
The patient is doing well on her current dopamine agonist medication.  She reports no active symptoms or excessive leg kicks.

## 2010-12-27 NOTE — Assessment & Plan Note (Signed)
Patient is doing fairly well from a narcolepsy standpoint on her current medication and naps as needed during the day.  She is having increased sleepiness during the day however do to her insomnia issues.  I have asked her to continue on her current medications.

## 2010-12-27 NOTE — Assessment & Plan Note (Signed)
The patient is having issues with sleep onset and sleep maintenance, and I think a lot of this is related to sleep hygiene and possibly psychophysiologic insomnia.  I have reviewed sleep hygiene with her, as well as various behavioral therapies that she can try.  I have also discussed with her stimulus control therapy.

## 2010-12-27 NOTE — Progress Notes (Signed)
Subjective:     Patient ID: Theresa Foster, female   DOB: Sep 27, 1980, 30 y.o.   MRN: [id number]  HPI The patient comes in today for followup of her multiple sleep issues.  She has known restless leg syndrome for which he is on dopamine agonist, and also has documented narcolepsy for which she is on stimulant medication during the day.  She has been doing fairly well with these issues on her current regimen.  A new issue for her is sleep onset and maintenance issues.  The patient states that when she tries to go to sleep her "mind is racing", and she will frequently get up and do housework or other stimulating activities.  She also has sleep hygiene issues with things such as reading or watching television in bed, and will stay active right up to her bedtime.  Because she is not sleeping much at night, this is translating into daytime sleepiness that is amplified by her known narcolepsy.  Review of Systems  HENT: Positive for congestion. Negative for ear pain, sore throat, rhinorrhea, sneezing, trouble swallowing, dental problem and postnasal drip.   Respiratory: Positive for cough and shortness of breath. Negative for chest tightness and wheezing.   Cardiovascular: Positive for palpitations. Negative for chest pain and leg swelling.  Gastrointestinal: Negative for nausea and abdominal pain.  Musculoskeletal: Negative for joint swelling.  Skin: Negative for rash.  Neurological: Positive for headaches.  Psychiatric/Behavioral: Positive for dysphoric mood. The patient is not nervous/anxious.        Objective:   Physical Exam Well-developed female in no acute distress Nose without purulence or discharge noted Chest is clear to auscultation Heart exam with regular rate and rhythm Lower extremities without edema, no cyanosis noted Alert, does not appear to be sleepy, moves all 4 extremities.    Assessment:         Plan:

## 2011-01-02 ENCOUNTER — Telehealth: Payer: Self-pay | Admitting: Pulmonary Disease

## 2011-01-02 NOTE — Telephone Encounter (Signed)
Nuvigil [place] initiated and APPROVED from 01/02/11 to 01/02/12 through Medicaid at [phone]. Member ID # [id number] O. Patient na pharmacy notified.

## 2011-06-26 ENCOUNTER — Telehealth: Payer: Self-pay | Admitting: Pulmonary Disease

## 2011-06-26 NOTE — Telephone Encounter (Signed)
Spoke with pt. She is requesting letter for school stating that date of her dx of narcolepsy, a brief description of dz, and also the meds she takes for this. She has ov with you tomorrow- 6-21.

## 2011-06-27 ENCOUNTER — Ambulatory Visit (INDEPENDENT_AMBULATORY_CARE_PROVIDER_SITE_OTHER): Payer: Self-pay | Admitting: Pulmonary Disease

## 2011-06-27 ENCOUNTER — Encounter: Payer: Self-pay | Admitting: Pulmonary Disease

## 2011-06-27 VITALS — BP 114/76 | HR 87 | Temp 98.4°F | Ht 64.0 in | Wt 137.8 lb

## 2011-06-27 DIAGNOSIS — G2581 Restless legs syndrome: Secondary | ICD-10-CM

## 2011-06-27 DIAGNOSIS — G4700 Insomnia, unspecified: Secondary | ICD-10-CM

## 2011-06-27 DIAGNOSIS — G47419 Narcolepsy without cataplexy: Secondary | ICD-10-CM

## 2011-06-27 NOTE — Patient Instructions (Addendum)
Try taking melatonin 3mg  over the counter about 3 hrs BEFORE your bedtime Continue with your behavioral therapy for insomnia.  Do not stay in bed if you cannot sleep, and don't ever get in bed during the day. Continue with your medications for your legs and narcolepsy.  If doing well, followup with me in 6mos

## 2011-06-27 NOTE — Assessment & Plan Note (Signed)
The patient is continuing on nuvigil during the day for her symptoms, and typically will have good and bad days.  She is trying to have good sleep hygiene, but this has been complicated by sleep onset issues.  I have reminded her to take occasional 15 minute naps during the day outside the bedroom, and to use caffeine in moderation as needed.

## 2011-06-27 NOTE — Assessment & Plan Note (Signed)
The patient continues to have issues with sleep onset, but is trying to follow stimulus control therapy as much as possible.  She thinks she may have an element of depression, and I have asked her to speak with her primary doctor or with a behavioral specialist at school.  I have reviewed again the behavioral therapies for this, and have also asked her to try melatonin.

## 2011-06-27 NOTE — Progress Notes (Signed)
  Subjective:    Patient ID: Theresa Foster, female    DOB: Mar 08, 1980, 31 y.o.   MRN: [id number]  HPI The patient comes in today for followup of her multiple sleep disorders.  She has known narcolepsy, periodic limb movement syndrome, and also difficulties with sleep onset.  She is taking nuvigil during the day which helps her sleepiness, and also takes a dopamine agonist at night for her leg movement.  She continues to have issues with sleep onset despite sticking consistently to stimulus control therapy.  This has been complicated by her young child who has been sick and keeping her awake at night.   Review of Systems  Constitutional: Negative.  Negative for fever and unexpected weight change.  HENT: Negative for ear pain, nosebleeds, congestion, sore throat, rhinorrhea, sneezing, trouble swallowing, dental problem, postnasal drip and sinus pressure.   Eyes: Negative for redness and itching.  Respiratory: Negative for cough, chest tightness, shortness of breath and wheezing.   Cardiovascular: Negative for palpitations and leg swelling.  Gastrointestinal: Negative for nausea and vomiting.  Genitourinary: Negative for dysuria.  Musculoskeletal: Negative for joint swelling.  Skin: Negative for rash.  Neurological: Negative for headaches.  Hematological: Does not bruise/bleed easily.  Psychiatric/Behavioral: Negative for dysphoric mood. The patient is not nervous/anxious.   All other systems reviewed and are negative.       Objective:   Physical Exam Well-developed female in no acute distress Nose without purulence or discharge noted Lower extremities without edema, no cyanosis Awake, but does appear sleepy, moves all 4 extremities.       Assessment & Plan:

## 2011-06-27 NOTE — Assessment & Plan Note (Signed)
The patient is doing well with her current dopamine agonist.

## 2011-07-01 NOTE — Telephone Encounter (Signed)
I spoke with the pt and she states she received the letter at her OV with [facility] on 06-27-11. Nothing further needed.Carron Curie, CMA

## 2011-07-25 ENCOUNTER — Other Ambulatory Visit: Payer: Self-pay | Admitting: Pulmonary Disease

## 2011-12-26 ENCOUNTER — Ambulatory Visit: Payer: Self-pay | Admitting: Pulmonary Disease

## 2012-01-15 ENCOUNTER — Telehealth: Payer: Self-pay | Admitting: Pulmonary Disease

## 2012-01-15 NOTE — Telephone Encounter (Signed)
Member ID# [id number] O. Nuvigil APPROVED for year year, 01/15/12 through 01/13/13. Pharmacy notified.   PA# [id number].

## 2012-01-30 ENCOUNTER — Ambulatory Visit: Payer: Self-pay | Admitting: Pulmonary Disease

## 2012-05-10 ENCOUNTER — Ambulatory Visit (INDEPENDENT_AMBULATORY_CARE_PROVIDER_SITE_OTHER): Payer: Medicaid Other | Admitting: Obstetrics & Gynecology

## 2012-05-10 ENCOUNTER — Encounter: Payer: Self-pay | Admitting: Obstetrics & Gynecology

## 2012-05-10 VITALS — BP 119/79 | HR 93 | Temp 97.9°F | Wt 138.0 lb

## 2012-05-10 DIAGNOSIS — N764 Abscess of vulva: Secondary | ICD-10-CM

## 2012-05-10 MED ORDER — CLINDAMYCIN HCL 300 MG PO CAPS: 300.0000 mg | ORAL_CAPSULE | Freq: Three times a day (TID) | ORAL | Status: DC

## 2012-05-10 NOTE — Patient Instructions (Addendum)
Vulvar Abscess  The vulva is made up of the large and small flaps of skin around the vagina opening. A vulvar abscess is an infected sac of yellowish white fluid (pus) in the skin flaps. Your doctor may make a small cut in the skin to drain the vulvar abscess.  HOME CARE  Only take medicine as told by your doctor.  Soak or take a warm water bath (sitz bath) 3 to 4 times a day for 15 to 20 minutes.  After you pee (urinate), always wipe from front to back.  Clean the vulvar abscess with soap and warm water. Do this after going to the bathroom.  Wear loose-fitting clothing.  Do not have sex until the vulvar abscess is gone or as told by your doctor. GET HELP RIGHT AWAY IF:   You have a temperature by mouth above 102 F (38.9 C).  The vulva area becomes more painful, puffy (swollen), or red.  You have fluid coming from the vulva area that is red or tan.  You have pain when you pee or have a hard time peeing. MAKE SURE YOU:  Understand these instructions.  Will watch your condition.  Will get help if you are not doing well or get worse. Document Released: 03/21/2008 Document Revised: 03/17/2011 Document Reviewed: 03/21/2008 Exit[employer] Patient Information 2013 [employer].  

## 2012-05-10 NOTE — Progress Notes (Signed)
Subjective:     Theresa Foster is a 32 y.o. female here for a routine exam.  Current complaints: pt states she has another abscess on her her pelvic region . Pt states she was given antibiotics last time and then had to come back and have it drained. Pt states she noticed this abscess about 3 days ago. Pt states it is the size of a ping pong ball. Pt states it is painful.  Personal health questionnaire reviewed: yes.   Gynecologic History Patient's last menstrual period was 04/19/2012. Contraception: none Last Pap: end of 2013. Results were: normal Last mammogram: n/a. Results were: n/a  Obstetric History OB History   Grav Para Term Preterm Abortions TAB SAB Ect Mult Living                   The following portions of the patient's history were reviewed and updated as appropriate: allergies, current medications, past family history, past medical history, past social history, past surgical history and problem list.  Review of Systems Pertinent items are noted in HPI.    Objective:    Pelvic: Mons pubis w/ 1 - 2 cm indurated area, no erythema, mildly tender    Assessment:   Vulva w/indurated area ?Community-acquired MRSA  Plan:   Antibiotics Sitz baths Return in a few days

## 2012-05-11 ENCOUNTER — Encounter: Payer: Self-pay | Admitting: Obstetrics & Gynecology

## 2012-05-11 DIAGNOSIS — N764 Abscess of vulva: Secondary | ICD-10-CM | POA: Insufficient documentation

## 2012-05-13 ENCOUNTER — Encounter: Payer: Self-pay | Admitting: Obstetrics & Gynecology

## 2012-05-13 ENCOUNTER — Ambulatory Visit (INDEPENDENT_AMBULATORY_CARE_PROVIDER_SITE_OTHER): Payer: Medicaid Other | Admitting: Obstetrics & Gynecology

## 2012-05-13 VITALS — BP 118/79 | HR 89 | Temp 98.3°F | Ht 64.0 in | Wt 142.2 lb

## 2012-05-13 DIAGNOSIS — N764 Abscess of vulva: Secondary | ICD-10-CM

## 2012-05-13 NOTE — Progress Notes (Signed)
.   Subjective:     Theresa Foster is a 32 y.o. female here for a follow up exam for her vulvar abscess.  Current complaints: - pt states that it is getting better with the antibiotics. .  Personal health questionnaire reviewed: no.   Gynecologic History Patient's last menstrual period was 04/19/2012. Contraception: none Last Pap: 2013. Results were: normal Last mammogram: N/A  Obstetric History OB History   Grav Para Term Preterm Abortions TAB SAB Ect Mult Living                   The following portions of the patient's history were reviewed and updated as appropriate: allergies, current medications, past family history, past medical history, past social history, past surgical history and problem list.  Review of Systems see above    Objective:    Mons: less induration  Assessment:   Resolving abscess, vulva  Plan:   Complete course of antibiotics Preventive measures reviewed

## 2012-05-13 NOTE — Patient Instructions (Signed)
Abscess  Care After  An abscess (also called a boil or furuncle) is an infected area that contains a collection of pus. Signs and symptoms of an abscess include pain, tenderness, redness, or hardness, or you may feel a moveable soft area under your skin. An abscess can occur anywhere in the body. The infection may spread to surrounding tissues causing cellulitis. A cut (incision) by the surgeon was made over your abscess and the pus was drained out. Gauze may have been packed into the space to provide a drain that will allow the cavity to heal from the inside outwards. The boil may be painful for 5 to 7 days. Most people with a boil do not have high fevers. Your abscess, if seen early, may not have localized, and may not have been lanced. If not, another appointment may be required for this if it does not get better on its own or with medications.  HOME CARE INSTRUCTIONS   · Only take over-the-counter or prescription medicines for pain, discomfort, or fever as directed by your caregiver.  · When you bathe, soak and then remove gauze or iodoform packs at least daily or as directed by your caregiver. You may then wash the wound gently with mild soapy water. Repack with gauze or do as your caregiver directs.  SEEK IMMEDIATE MEDICAL CARE IF:   · You develop increased pain, swelling, redness, drainage, or bleeding in the wound site.  · You develop signs of generalized infection including muscle aches, chills, fever, or a general ill feeling.  · An oral temperature above 102° F (38.9° C) develops, not controlled by medication.  See your caregiver for a recheck if you develop any of the symptoms described above. If medications (antibiotics) were prescribed, take them as directed.  Document Released: 07/11/2004 Document Revised: 03/17/2011 Document Reviewed: 03/08/2007  ExitCare® Patient Information ©2013 [employer].

## 2012-05-26 ENCOUNTER — Ambulatory Visit (INDEPENDENT_AMBULATORY_CARE_PROVIDER_SITE_OTHER): Payer: Medicaid Other | Admitting: Obstetrics & Gynecology

## 2012-05-26 ENCOUNTER — Encounter: Payer: Self-pay | Admitting: Obstetrics & Gynecology

## 2012-05-26 VITALS — BP 125/84 | HR 108 | Temp 97.1°F | Ht 64.0 in | Wt 140.0 lb

## 2012-05-26 DIAGNOSIS — N764 Abscess of vulva: Secondary | ICD-10-CM

## 2012-05-26 MED ORDER — FLUCONAZOLE 150 MG PO TABS: 150.0000 mg | ORAL_TABLET | Freq: Once | ORAL | Status: DC

## 2012-05-26 MED ORDER — ACETAMINOPHEN-CODEINE #2 300-15 MG PO TABS: 1.0000 | ORAL_TABLET | ORAL | Status: DC | PRN

## 2012-05-26 NOTE — Progress Notes (Signed)
Subjective:     Theresa Foster is a 32 y.o. female here for a problem visit.  Current complaints: bump on right labia x 2 weeks with pain.     Gynecologic History Patient's last menstrual period was 05/12/2012. Contraception: none  The following portions of the patient's history were reviewed and updated as appropriate: allergies, current medications, past family history, past medical history, past social history, past surgical history and problem list.  Review of Systems Pertinent items are noted in HPI.    Objective:   Mons pubis--1 - 2 cm, tender, fluctuant area, pointing.  The patient was consented for an I & D.  A timeout was performed.  The area was prepped with Betadine.  The overlying skin was infiltrated with 1 % lidocaine plain.  The skin was incised; the abscess cavity was probed/irrigated/packed.  Assessment:    Abscess of vulva--s/p I & D Likely candida vulvovaginitis   Plan:    Continue Sitz baths Dilflucan  Return in 2 days

## 2012-05-26 NOTE — Patient Instructions (Signed)
Abscess  Care After  An abscess (also called a boil or furuncle) is an infected area that contains a collection of pus. Signs and symptoms of an abscess include pain, tenderness, redness, or hardness, or you may feel a moveable soft area under your skin. An abscess can occur anywhere in the body. The infection may spread to surrounding tissues causing cellulitis. A cut (incision) by the surgeon was made over your abscess and the pus was drained out. Gauze may have been packed into the space to provide a drain that will allow the cavity to heal from the inside outwards. The boil may be painful for 5 to 7 days. Most people with a boil do not have high fevers. Your abscess, if seen early, may not have localized, and may not have been lanced. If not, another appointment may be required for this if it does not get better on its own or with medications.  HOME CARE INSTRUCTIONS   · Only take over-the-counter or prescription medicines for pain, discomfort, or fever as directed by your caregiver.  · When you bathe, soak and then remove gauze or iodoform packs at least daily or as directed by your caregiver. You may then wash the wound gently with mild soapy water. Repack with gauze or do as your caregiver directs.  SEEK IMMEDIATE MEDICAL CARE IF:   · You develop increased pain, swelling, redness, drainage, or bleeding in the wound site.  · You develop signs of generalized infection including muscle aches, chills, fever, or a general ill feeling.  · An oral temperature above 102° F (38.9° C) develops, not controlled by medication.  See your caregiver for a recheck if you develop any of the symptoms described above. If medications (antibiotics) were prescribed, take them as directed.  Document Released: 07/11/2004 Document Revised: 03/17/2011 Document Reviewed: 03/08/2007  ExitCare® Patient Information ©2014 [employer].

## 2012-05-27 ENCOUNTER — Encounter: Payer: Self-pay | Admitting: Obstetrics & Gynecology

## 2012-05-28 ENCOUNTER — Ambulatory Visit (INDEPENDENT_AMBULATORY_CARE_PROVIDER_SITE_OTHER): Payer: Medicaid Other | Admitting: Obstetrics & Gynecology

## 2012-05-28 ENCOUNTER — Encounter: Payer: Self-pay | Admitting: Obstetrics & Gynecology

## 2012-05-28 VITALS — BP 123/91 | HR 87 | Temp 98.3°F | Ht 64.0 in | Wt 139.6 lb

## 2012-05-28 DIAGNOSIS — N764 Abscess of vulva: Secondary | ICD-10-CM

## 2012-05-28 NOTE — Progress Notes (Signed)
.   Subjective:     Theresa Foster is a 32 y.o. female here for a follow up exam for her vulvar abscess s/p I&D (05/26/12).  No current complaints.  Personal health questionnaire reviewed: no.   Gynecologic History Patient's last menstrual period was 05/12/2012. Contraception: none   Obstetric History OB History   Grav Para Term Preterm Abortions TAB SAB Ect Mult Living                   The following portions of the patient's history were reviewed and updated as appropriate: allergies, current medications, past family history, past medical history, past social history, past surgical history and problem list.  Review of Systems Pertinent items are noted in HPI.    Objective:     I&D site less indurated     Assessment:   Abscess, vulva--healing well   Plan:    Continue Sitz baths F/U prn

## 2012-05-28 NOTE — Patient Instructions (Signed)
Incision and Drainage of Abscess An abscess (boil or furuncle) is an area infected by germs that contains a collection of pus. Signs and problems (symptoms) of an abscess include pain, tenderness, redness, or hardness. You may feel a moveable, soft area under your skin. An abscess can occur anywhere in the body. Occasionally, this may spread to surrounding tissues causing cellulitis. Sometimes, a surgeon may make a cut (incision) over your abscess. The pus is drained. Gauze may be packed into the space to provide a drain. Keeping a drain or piece of gauze in the incision keeps the skin from healing first. This helps stop the abscess from forming again. The area may be painful for 5 to 7 days. Most people with an abscess do not have high fevers. If seen early, your abscess may not have localized and may not be cut. If it does not get better on its own or with medicines, you may require another appointment. HOME CARE INSTRUCTIONS   Only take over-the-counter or prescription medicines for pain, discomfort, or fever as directed by your caregiver. Use these only if your caregiver has not given medicines that would interfere.   When you bathe, remove the gauze drain after soaking. You may then wash the wound gently with mild, soapy water. Repack with gauze as your caregiver directs.   See your caregiver as directed for a recheck.   If antibiotics were prescribed, take them as directed.  SEEK MEDICAL CARE IF:   You develop increased pain, swelling, redness, drainage, or bleeding in the wound site.   You develop signs of generalized infection, including muscle aches, chills, or a general ill feeling.   You or your child has an oral temperature above 102 F (38.9 C).  MAKE SURE YOU:   Understand these instructions.   Will watch your condition.   Will get help right away if you are not doing well or get worse.  Document Released: 06/18/2000 Document Revised: 09/04/2010 Document Reviewed:  08/13/2007 ExitCare Patient Information 2012 [employer]. 

## 2012-11-11 ENCOUNTER — Other Ambulatory Visit: Payer: Self-pay

## 2012-12-27 ENCOUNTER — Telehealth: Payer: Self-pay | Admitting: Pulmonary Disease

## 2012-12-27 NOTE — Telephone Encounter (Signed)
MyChart Appointment Request copied below:     Appointment Request From: Verdell Face  With Provider: Barbaraann Share, MD [facility]]  Preferred Date Range: From 12/27/2012 To 01/15/2013  Preferred Times: Monday Morning, Tuesday Morning, Thursday Morning, Friday Morning  Reason for visit: Office Visit  Comments:

## 2013-01-12 ENCOUNTER — Encounter: Payer: Self-pay | Admitting: Pulmonary Disease

## 2013-01-12 ENCOUNTER — Ambulatory Visit (INDEPENDENT_AMBULATORY_CARE_PROVIDER_SITE_OTHER): Payer: Medicaid Other | Admitting: Pulmonary Disease

## 2013-01-12 VITALS — BP 108/80 | HR 80 | Temp 97.7°F | Ht 64.0 in | Wt 143.4 lb

## 2013-01-12 DIAGNOSIS — G4700 Insomnia, unspecified: Secondary | ICD-10-CM

## 2013-01-12 DIAGNOSIS — G47419 Narcolepsy without cataplexy: Secondary | ICD-10-CM

## 2013-01-12 DIAGNOSIS — R569 Unspecified convulsions: Secondary | ICD-10-CM

## 2013-01-12 DIAGNOSIS — G2581 Restless legs syndrome: Secondary | ICD-10-CM

## 2013-01-12 MED ORDER — ARMODAFINIL 150 MG PO TABS: ORAL_TABLET | ORAL | Status: DC

## 2013-01-12 MED ORDER — PRAMIPEXOLE DIHYDROCHLORIDE 0.125 MG PO TABS: 0.1250 mg | ORAL_TABLET | Freq: Every day | ORAL | Status: DC

## 2013-01-12 NOTE — Assessment & Plan Note (Signed)
The patient has been using Nuvigil once a day, and feels that it continues to help her with alertness during the day. However, she has periods where she will get profoundly sleepy, primarily related to poor sleep the previous night.

## 2013-01-12 NOTE — Assessment & Plan Note (Signed)
The patient continues to have issues with good sleep hygiene and her sleep schedule. I have reviewed with her again behavioral therapies, and asked her to try and stick with these is much as possible.

## 2013-01-12 NOTE — Patient Instructions (Signed)
Will refer to neurology to make sure this is not some kind of neurology issue.  Continue on nuvigil and mirapex. Continue to work on good sleep hygiene.  followup with me in one year, but call if increased symptoms.

## 2013-01-12 NOTE — Assessment & Plan Note (Signed)
The patient continues to take her dopamine agonist, and feels like it is definitely helping her limb movements.

## 2013-01-12 NOTE — Assessment & Plan Note (Addendum)
The patient is having a few episodes where a friend hasn't witnessed some type of abnormal movement that was felt to possibly represent a seizure. The patient denied this being a sleep start, and the observers felt like it lasted at least a minute in duration. I have reviewed the drug interaction data for nuvigil and also Mirapex, and did not see seizure activity listed as an issue. 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

## 2013-01-12 NOTE — Progress Notes (Signed)
   Subjective:    Patient ID: Theresa Foster, female    DOB: Nov 06, 1980, 33 y.o.   MRN: [id number]  HPI The patient comes in today for followup of her multiple sleep disorders. She has narcolepsy, periodic limb movement disorder, and also issues with her sleep schedule and sleep hygiene. She is remaining functional during the day with new visual, and also has been taking her dopamine agonist. She continues to have both good days and bad, depending upon how much she slept the prior night. She recently was noted by a friend to have an episode of repeated movements that was felt to possibly represent a seizure. From her description, it does not sound like a sleep start, since it lasted at least a minute according to observers.  The patient was completely unaware of this activity. She has also had occasions during the day where she began to have bobbing of her head for a period of time, and felt like she could not move the rest of her body. This sounds more consistent with cataplexy. The patient has no prior history of seizure disorder.   Review of Systems  Constitutional: Negative for fever and unexpected weight change.  HENT: Negative for congestion, dental problem, ear pain, nosebleeds, postnasal drip, rhinorrhea, sinus pressure, sneezing, sore throat and trouble swallowing.   Eyes: Negative for redness and itching.  Respiratory: Negative for cough, chest tightness, shortness of breath and wheezing.   Cardiovascular: Positive for palpitations. Negative for leg swelling.  Gastrointestinal: Negative for nausea and vomiting.  Genitourinary: Negative for dysuria.  Musculoskeletal: Negative for joint swelling.  Skin: Negative for rash.  Neurological: Negative for headaches.       Twitching-- alert during body twitches, but cannot stop them  Hematological: Does not bruise/bleed easily.  Psychiatric/Behavioral: Negative for dysphoric mood. The patient is not nervous/anxious.        Objective:   Physical Exam Well-developed female in no acute distress Nose without purulence or discharge noted Neck without lymphadenopathy or thyromegaly  Lower extremities without edema or cyanosis Alert and oriented, does not appear to be sleepy, moves all 4 extremities.       Assessment & Plan:

## 2013-01-17 ENCOUNTER — Ambulatory Visit: Payer: Medicaid Other | Admitting: Obstetrics & Gynecology

## 2013-03-02 ENCOUNTER — Ambulatory Visit (INDEPENDENT_AMBULATORY_CARE_PROVIDER_SITE_OTHER): Payer: Medicaid Other | Admitting: Obstetrics & Gynecology

## 2013-03-02 ENCOUNTER — Encounter: Payer: Self-pay | Admitting: Obstetrics & Gynecology

## 2013-03-02 VITALS — BP 127/85 | HR 89 | Temp 98.5°F | Ht 64.0 in | Wt 148.0 lb

## 2013-03-02 DIAGNOSIS — Z113 Encounter for screening for infections with a predominantly sexual mode of transmission: Secondary | ICD-10-CM

## 2013-03-02 NOTE — Progress Notes (Signed)
Subjective:     Theresa Foster is a [age] y.o. female here for a routine exam.  Current complaints: Patient is in the office today for an Annual Exam. Patient denies any concerns. Patient states she would like blood work for STDs. Personal health questionnaire reviewed: yes.   Gynecologic History Patient's last menstrual period was 02/19/2013. Contraception: none Last Pap: 08/01/2011. Results were: normal  Obstetric History OB History  Gravida Para Term Preterm AB SAB TAB Ectopic Multiple Living  1 1 1       1     # Outcome Date GA Lbr Len/2nd Weight Sex Delivery Anes PTL Lv  1 TRM 02/01/04 1985w5d  7 lb 11 oz (3.487 kg) M SVD EPI  Y       The following portions of the patient's history were reviewed and updated as appropriate: allergies, current medications, past family history, past medical history, past social history, past surgical history and problem list.  Pt not seen by provider.

## 2013-03-09 ENCOUNTER — Ambulatory Visit (INDEPENDENT_AMBULATORY_CARE_PROVIDER_SITE_OTHER): Payer: Medicaid Other | Admitting: Radiology

## 2013-03-09 ENCOUNTER — Encounter: Payer: Self-pay | Admitting: Neurology

## 2013-03-09 ENCOUNTER — Ambulatory Visit (INDEPENDENT_AMBULATORY_CARE_PROVIDER_SITE_OTHER): Payer: Medicaid Other | Admitting: Neurology

## 2013-03-09 VITALS — BP 116/88 | HR 100 | Resp 17 | Ht 65.75 in | Wt 144.0 lb

## 2013-03-09 DIAGNOSIS — R569 Unspecified convulsions: Secondary | ICD-10-CM

## 2013-03-09 DIAGNOSIS — G47411 Narcolepsy with cataplexy: Secondary | ICD-10-CM

## 2013-03-09 NOTE — Progress Notes (Signed)
[facility]  Provider:  Melvyn Novas, M D  Referring Provider: Barbaraann Share, MD Primary Care Physician:  Barbaraann Share, MD  Chief Complaint  Patient presents with  . New Evaluation    Room 11  . Seizures  . Sleep consult    HPI:  Theresa Foster is a 33 y.o. female  Is seen here as a referral  from Dr. Shelle Iron for a possible seizure in sleep.   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 She takes a  dopaminergic agonsist for RLS as well.  She underwent sleep studies , had no significant apnea at the time ( 2009) but is snoring. The MSLT was reportedly performed 2 years later and abnormal.  Theresa Foster 23-year-old son has reported that sometimes his mom ' rolls" her eyes back while  asleep and had therefore already suspected  a possible seizure .  A friend had noticed Theresa Foster to have a 1 minute "convulsion" during sleep for which the patient has no recollection and is completely amnestic .  She remembers waking up without any injuries,  tongue bite or urinary incontinence -but feeling as if her body was not yet awake while  her brain was  The patient has reported multiple times experiencing sleep paralysis, also an old of body experience as if she can perceive concerns everything that is going on around her without being able to react to it. She also reports hypnagogic and hypnopompic hallucinations. Some spells she describes are clearly cataplectic, the feeling of heaviness keeping her from reacting to a startle. Her hands will be unable to move.     Date that she tries to sleep whenever she possibly can, the patient states that she will put her son on the school bus at 20 so she has to rise with him at about 42 AM, a 23-year-old is not easy to get ready in the morning. She either has relied herself on more than one alarm and is is since childhood on to  all the sleep. He does not drink caffeine, she states she often falls asleep at 9 AM, is awake after 30 minutes , craving another powernap between 12 and 13 hours.  Son returns at 3.30 PM and she is always up by that time.  She sleeps at night about 4 hours and another 3-4 hours throughout the day.     Review of Systems: Out of a complete 14 system review, the patient complains of only the following symptoms, and all other reviewed systems are negative. Sleep paralysis, startle cataplexy, hallucinations. Vivid dreams.   History   Social History  . Marital Status: Single    Spouse Name: N/A    Number of Children: 1  . Years of Education: 14+   Occupational History  . Student     full-time [employer]   Social History Main Topics  . Smoking status: Never Smoker   . Smokeless tobacco: Never Used  . Alcohol Use: Yes     Comment: rare  . Drug Use: No  . Sexual Activity: Yes    Partners: Female    Pharmacist, hospital Protection: None   Other Topics Concern  . Not on file   Social History Narrative   Patient is single and lives at home with her son.   Patient has one child.   Patient is currently attending college (Sophomore).  Patient is right-handed.   Patient drinks very little caffeine.    Family History  Problem Relation Age of Onset  . Cancer Maternal Uncle     unsure what kind    Past Medical History  Diagnosis Date  . RLS (restless legs syndrome)   . Narcolepsy without cataplexy(347.00)   . Hypersomnia     Past Surgical History  Procedure Laterality Date  . No past surgeries      Current Outpatient Prescriptions  Medication Sig Dispense Refill  . Armodafinil (NUVIGIL) 150 MG tablet TAKE 1 TABLET BY MOUTH EVERY MORNING AND TAKE 1 ADDITIONAL TABLET AT 1 PM IF NEEDED  60 tablet  5  . MELATONIN PO Take by mouth at bedtime.      . pramipexole (MIRAPEX) 0.125 MG tablet Take 1 tablet (0.125 mg total) by mouth at bedtime.  30 tablet  11   No current  facility-administered medications for this visit.    Allergies as of 03/09/2013  . (No Known Allergies)    Vitals: BP 116/88  Pulse 100  Resp 17  Ht 5' 5.75" (1.67 m)  Wt 144 lb (65.318 kg)  BMI 23.42 kg/m2  LMP 02/19/2013 Last Weight:  Wt Readings from Last 1 Encounters:  03/09/13 144 lb (65.318 kg)   Last Height:   Ht Readings from Last 1 Encounters:  03/09/13 5' 5.75" (1.67 m)    Physical exam:  General: The patient is awake, alert and appears not in acute distress. The patient is well groomed. Head: Normocephalic, atraumatic. Neck is supple. Mallampati 3  neck circumference: 14.75  Cardiovascular:  Regular rate and rhythm , without  murmurs or carotid bruit, and without distended neck veins. Respiratory: Lungs are clear to auscultation. Skin:  Without evidence of edema, or rash Trunk: BMI is normal .  Neurologic exam : The patient is awake and alert, oriented to place and time.  Memory subjective described as intact.  There is a normal attention span & concentration ability. Speech is fluent withoutdysarthria, dysphonia or aphasia. Mood and affect are appropriate.  Cranial nerves: Pupils are equal and briskly reactive to light. Funduscopic exam without  evidence of pallor or edema. Extraocular movements  in vertical and horizontal planes intact and without nystagmus. Visual fields by finger perimetry are intact. Hearing to finger rub intact.  Facial sensation intact to fine touch. Facial motor strength is symmetric and tongue and uvula move midline.  Motor exam:   Normal tone and normal muscle bulk and symmetric normal strength in all extremities.  Sensory:  Fine touch, pinprick and vibration were tested in all extremities.  Proprioception was normal.  Coordination: Rapid alternating movements in the fingers/hands is tested and normal. Finger-to-nose maneuver tested and normal without evidence of ataxia, dysmetria or tremor.  Gait and station: Patient walks without  assistive device . Deep tendon reflexes: in the  upper and lower extremities are symmetric and intact. Babinski maneuver response is  downgoing.   Assessment:  After physical and neurologic examination, review of laboratory studies, imaging, neurophysiology testing and pre-existing records, assessment is  : consider [facility] for the narcolepsy with  cataplexy , Epworth of 23 points. FSS 57. On nuvigil , with associated tachycardia and palpitations. .   seizure unlikely - this may be REM onset activity and dopaminergic agoniists can impair the REM motor reduction.   Plan:  Treatment plan and additional workup : EEG sleep deprived ,  Consider [facility] - letter to Marcelyn BruinsKeith Clance MD.   Melvyn NovasHMEIER,Sybel Standish,  MD

## 2013-03-09 NOTE — Patient Instructions (Signed)
Sodium Oxybate oral solution What is this medicine? SODIUM OXYBATE (SOE dee um OX i bate) is used to treat excessive sleepiness and cataplexy in patients with narcolepsy. Cataplexy causes a sudden muscle weakness due to a strong emotional response. This medicine is not available in retail pharmacies. Your doctor will enroll you in a program that will provide the drug to you. This medicine may be used for other purposes; ask your health care provider or pharmacist if you have questions. COMMON BRAND NAME(S): Xyrem What should I tell my health care provider before I take this medicine? They need to know if you have any of these conditions: -depression, psychosis, or other mood disorders -heart disease or high blood pressure -if you frequently drink alcohol containing beverages -if you have a history of drug or alcohol abuse -liver disease -lung disease or difficulty breathing -seizures -succinic semialdehyde dehydrogenase deficiency -thoughts of suicide -an unusual or allergic reaction to sodium oxybate, other medicines, foods, dyes, or preservatives -pregnant or trying to get pregnant -breast-feeding How should I use this medicine? Take this medicine by mouth. Follow the directions on the prescription label. Take this medicine on an empty stomach, at least 30 minutes before or 2 hours after food. Do not take with food. Do not take your medicine more often than directed. A special MedGuide will be given to you by the pharmacist with each prescription and refill. Be sure to read this information carefully each time. Talk to your pediatrician regarding the use of this medicine in children. Special care may be needed. Overdosage: If you think you have taken too much of this medicine contact a poison control center or emergency room at once. NOTE: This medicine is only for you. Do not share this medicine with others. What if I miss a dose? Skip the missed dose. If it is almost time for your next  dose, take only that dose. Allow at least two and one-half hours between each nightly dose. Do not take double or extra doses. What may interact with this medicine? Do not take this medicine with any of the following medications: -alcohol -barbiturates, like phenobarbital -medicines commonly used for anxiety, sedation or insomnia This medicine may also interact with the following medications: -bupropion -divalproex sodium -dronabinol or marijuana -medicines for psychosis or severe mood disturbances -muscle relaxants -other stimulants, although these are commonly used with sodium oxybate -prescription pain medicines, including tramadol -valproate or valproic acid This list may not describe all possible interactions. Give your health care provider a list of all the medicines, herbs, non-prescription drugs, or dietary supplements you use. Also tell them if you smoke, drink alcohol, or use illegal drugs. Some items may interact with your medicine. What should I watch for while using this medicine? The use of this medicine requires careful supervision. Visit your doctor or health care professional for regular checks on your progress. Do not suddenly stop taking this medicine if you have been taking it for a long time. Withdrawal symptoms may occur. Your doctor or health care professional may need to slowly stop your doses. This medicine may affect your concentration or function. Let your doctor or health care professional know if you have increased sleepiness or confusion during the day. This medicine causes sleep very quickly. You should only take this drug at bedtime, while in bed. Do not drive a car, operate heavy machinery or perform any activities that require mental alertness for at least 6 hours after taking this drug. Use extreme care in any such   daily activities until you know how this medicine affects you. Because alcohol may interfere with this medicine and may cause serious side effects,  you must avoid alcohol-containing beverages while on this medicine. Do not take this medicine along with sleep medicines or other drugs with strong sedative effects, serious side effects may occur. This medicine can be dangerous in overdose. If you take more than prescribed or take it by accident, get emergency medical help right away. What side effects may I notice from receiving this medicine? Side effects that you should report to your doctor or health care professional as soon as possible: -allergic reactions like skin rash, itching or hives, swelling of the face, lips, or tongue -breathing problems -confusion -fast, irregular heartbeat -increased blood pressure, particularly if you already have high blood pressure -memory loss -seizures -sleepwalking -tremors or shaking movements -urinary incontinence Side effects that usually do not require medical attention (report to your doctor or health care professional if they continue or are bothersome): -dizziness -drowsiness -headache -increased urination -nausea, vomiting or stomach upset -unusual dreams This list may not describe all possible side effects. Call your doctor for medical advice about side effects. You may report side effects to FDA at 1-800-FDA-1088. Where should I keep my medicine? Keep out of reach of children. This medicine can be abused. Keep your medicine in a safe place to protect it from theft. Do not share this medicine with anyone. Selling or giving away this medicine is dangerous and against the law. Store at room temperature between 15 and 30 degrees C (59 and 86 degrees F). This medicine may cause accidental overdose and death if it is taken by other adults, children, or pets. Flush any unused medicine down the toilet to reduce the chance of harm. Do not use the medicine after the expiration date. NOTE: This sheet is a summary. It may not cover all possible information. If you have questions about this medicine,  talk to your doctor, pharmacist, or health care provider.  2014, Elsevier/Gold Standard. (2012-07-21 15:45:13)  

## 2013-03-11 NOTE — Procedures (Signed)
   [facility]  EEG (ELECTROENCEPHALOGRAM) REPORT   STUDY DATE: 03/11/13 PATIENT NAME: Theresa Foster DOB: Sep 17, 1980 MRN: [id number]  ORDERING CLINICIAN: Melvyn Novasarmen Dohmeier, MD   TECHNOLOGIST: Kaylyn LimSue Fox TECHNIQUE: Sleep deprived electroencephalogram was recorded utilizing standard 10-20 system of lead placement and reformatted into average and bipolar montages.  RECORDING TIME: 30 minutes ACTIVATION: hyperventilation and photic stimulation  CLINICAL INFORMATION: 33 year old female with possible seizures.  FINDINGS: Background rhythms of 9-10 hertz and 20-30 microvolts. No focal, lateralizing, epileptiform activity or seizures are seen. Patient recorded in the awake and asleep state. EKG channel shows 84 beats per minute.  IMPRESSION:  Normal sleep deprived EEG in the awake and asleep states.   INTERPRETING PHYSICIAN:  Suanne MarkerVIKRAM R. Carinna Newhart, MD Certified in Neurology, Neurophysiology and Neuroimaging  [facility] [address], [address] [place], [place] [address] [phone]

## 2013-03-15 ENCOUNTER — Other Ambulatory Visit: Payer: Medicaid Other | Admitting: Radiology

## 2013-04-14 NOTE — Progress Notes (Signed)
Quick Note:  Called to share normal EEG results per Dr Dohmeier's findings thru VM ______

## 2013-08-23 ENCOUNTER — Other Ambulatory Visit: Payer: Self-pay | Admitting: Pulmonary Disease

## 2013-09-07 ENCOUNTER — Telehealth: Payer: Self-pay | Admitting: *Deleted

## 2013-09-07 NOTE — Telephone Encounter (Signed)
PA form received and placed in [facility] look at. Please advise thanks

## 2013-09-21 ENCOUNTER — Encounter: Payer: Self-pay | Admitting: Pulmonary Disease

## 2013-11-07 ENCOUNTER — Encounter: Payer: Self-pay | Admitting: Neurology

## 2014-01-02 ENCOUNTER — Encounter: Payer: Self-pay | Admitting: *Deleted

## 2014-04-18 ENCOUNTER — Encounter: Payer: Self-pay | Admitting: Certified Nurse Midwife

## 2014-04-18 ENCOUNTER — Ambulatory Visit (INDEPENDENT_AMBULATORY_CARE_PROVIDER_SITE_OTHER): Payer: Medicaid Other | Admitting: Certified Nurse Midwife

## 2014-04-18 VITALS — BP 129/85 | HR 113 | Temp 98.8°F | Ht 64.0 in | Wt 142.0 lb

## 2014-04-18 DIAGNOSIS — Z0000 Encounter for general adult medical examination without abnormal findings: Secondary | ICD-10-CM | POA: Diagnosis not present

## 2014-04-18 DIAGNOSIS — Z124 Encounter for screening for malignant neoplasm of cervix: Secondary | ICD-10-CM

## 2014-04-18 DIAGNOSIS — Z01419 Encounter for gynecological examination (general) (routine) without abnormal findings: Secondary | ICD-10-CM | POA: Diagnosis not present

## 2014-04-18 LAB — COMPREHENSIVE METABOLIC PANEL
ALBUMIN: 4.5 g/dL (ref 3.5–5.2)
ALK PHOS: 58 U/L (ref 39–117)
AST: 13 U/L (ref 0–37)
BILIRUBIN TOTAL: 0.3 mg/dL (ref 0.2–1.2)
BUN: 7 mg/dL (ref 6–23)
CO2: 24 mEq/L (ref 19–32)
Calcium: 9.6 mg/dL (ref 8.4–10.5)
Chloride: 101 mEq/L (ref 96–112)
Creat: 0.68 mg/dL (ref 0.50–1.10)
GLUCOSE: 87 mg/dL (ref 70–99)
POTASSIUM: 4.2 meq/L (ref 3.5–5.3)
SODIUM: 137 meq/L (ref 135–145)
TOTAL PROTEIN: 8.1 g/dL (ref 6.0–8.3)

## 2014-04-18 LAB — TSH: TSH: 1.266 u[IU]/mL (ref 0.350–4.500)

## 2014-04-18 LAB — CHOLESTEROL, TOTAL: CHOLESTEROL: 193 mg/dL (ref 0–200)

## 2014-04-18 LAB — HDL CHOLESTEROL: HDL: 45 mg/dL — ABNORMAL LOW (ref >=46)

## 2014-04-18 LAB — TRIGLYCERIDES: Triglycerides: 56 mg/dL (ref <150)

## 2014-04-18 NOTE — Progress Notes (Signed)
Patient ID: Theresa Foster, female   DOB: 07-May-1980, 34 y.o.   MRN: [id number]    Subjective:     Theresa Foster is a 34 y.o. female here for a routine exam.  Current complaints: none.  Not employed, going to school. Has narcolepsy and RLS.  Menses regular monthly, last 5-6 days.    Grandmother with CVA last November.   Desires STD screening.     Personal health questionnaire:  Is patient Ashkenazi Jewish, have a family history of breast and/or ovarian cancer: no Is there a family history of uterine cancer diagnosed at age < 6950, gastrointestinal cancer, urinary tract cancer, family member who is a Personnel officerLynch syndrome-associated carrier: no Is the patient overweight and hypertensive, family history of diabetes, personal history of gestational diabetes, preeclampsia or PCOS: no Is patient over 4655, have PCOS,  family history of premature CHD under age 32, diabetes, smoke, have hypertension or peripheral artery disease:  yes At any time, has a partner hit, kicked or otherwise hurt or frightened you?: no Over the past 2 weeks, have you felt down, depressed or hopeless?: no Over the past 2 weeks, have you felt little interest or pleasure in doing things?:sometimes   Gynecologic History Patient's last menstrual period was 03/28/2014. Contraception: homosexual Last Pap: unknown. Results were: normal according to the patient Last mammogram: none.   Obstetric History OB History  Gravida Para Term Preterm AB SAB TAB Ectopic Multiple Living  1 1 1       1     # Outcome Date GA Lbr Len/2nd Weight Sex Delivery Anes PTL Lv  1 Term 02/01/04 [id number]  3.487 kg (7 lb 11 oz) M Vag-Spont EPI  Y      Past Medical History  Diagnosis Date  . RLS (restless legs syndrome)   . Narcolepsy without cataplexy   . Hypersomnia     Past Surgical History  Procedure Laterality Date  . No past surgeries       Current outpatient prescriptions:  Marland Kitchen.  MELATONIN PO, Take by mouth at bedtime., Disp: , Rfl:  .  NUVIGIL 150  MG tablet, TAKE 1 TABLET BY MOUTH EVERY MORNING AND TAKE 1 ADDITIONAL TABLET AT 1 PM IF NEEDED, Disp: 60 tablet, Rfl: 5 .  pramipexole (MIRAPEX) 0.125 MG tablet, Take 1 tablet (0.125 mg total) by mouth at bedtime., Disp: 30 tablet, Rfl: 11 No Known Allergies  History  Substance Use Topics  . Smoking status: Never Smoker   . Smokeless tobacco: Never Used  . Alcohol Use: 0.0 oz/week    0 Standard drinks or equivalent per week     Comment: rare    Family History  Problem Relation Age of Onset  . Cancer Maternal Uncle     unsure what kind      Review of Systems  Constitutional: negative for fatigue and weight loss Respiratory: negative for cough and wheezing Cardiovascular: negative for chest pain, fatigue and palpitations Gastrointestinal: negative for abdominal pain and change in bowel habits Musculoskeletal:negative for myalgias Neurological: negative for gait problems and tremors Behavioral/Psych: negative for abusive relationship, depression Endocrine: negative for temperature intolerance   Genitourinary:negative for abnormal menstrual periods, genital lesions, hot flashes, sexual problems and vaginal discharge Integument/breast: negative for breast lump, breast tenderness, nipple discharge and skin lesion(s)    Objective:       BP 129/85 mmHg  Pulse 113  Temp(Src) 98.8 F (37.1 C)  Ht 5\' 4"  (1.626 m)  Wt 64.411 kg (142 lb)  BMI 24.36  kg/m2  LMP 03/28/2014 General:   alert  Skin:   no rash or abnormalities  Lungs:   clear to auscultation bilaterally  Heart:   regular rate and rhythm, S1, S2 normal, no murmur, click, rub or gallop  Breasts:   normal without suspicious masses, skin or nipple changes or axillary nodes  Abdomen:  normal findings: no organomegaly, soft, non-tender and no hernia  Pelvis:  External genitalia: normal general appearance Urinary system: urethral meatus normal and bladder without fullness, nontender Vaginal: normal without tenderness,  induration or masses Cervix: normal appearance Adnexa: normal bimanual exam Uterus: anteverted and non-tender, normal size   Lab Review Urine pregnancy test Labs reviewed yes Radiologic studies reviewed no  50% of 30 min visit spent on counseling and coordination of care.   Assessment:    Healthy female exam.    Plan:   Encouraged use of condoms for STD prevention, patient verbalized understanding  Education reviewed: depression evaluation, low fat, low cholesterol diet, safe sex/STD prevention, self breast exams, skin cancer screening and weight bearing exercise. Contraception: none. Follow up in: 1 year.   No orders of the defined types were placed in this encounter.   Orders Placed This Encounter  Procedures  . SureSwab, Vaginosis/Vaginitis Plus   Need to obtain previous records

## 2014-04-19 LAB — CBC WITH DIFFERENTIAL/PLATELET
Basophils Absolute: 0 10*3/uL (ref 0.0–0.1)
Basophils Relative: 0 % (ref 0–1)
EOS ABS: 0.1 10*3/uL (ref 0.0–0.7)
EOS PCT: 2 % (ref 0–5)
HEMATOCRIT: 42.1 % (ref 36.0–46.0)
HEMOGLOBIN: 14.2 g/dL (ref 12.0–15.0)
LYMPHS ABS: 2 10*3/uL (ref 0.7–4.0)
Lymphocytes Relative: 43 % (ref 12–46)
MCH: 30.7 pg (ref 26.0–34.0)
MCHC: 33.7 g/dL (ref 30.0–36.0)
MCV: 90.9 fL (ref 78.0–100.0)
MONOS PCT: 9 % (ref 3–12)
MPV: 11.1 fL (ref 8.6–12.4)
Monocytes Absolute: 0.4 10*3/uL (ref 0.1–1.0)
Neutro Abs: 2.1 10*3/uL (ref 1.7–7.7)
Neutrophils Relative %: 46 % (ref 43–77)
Platelets: 290 10*3/uL (ref 150–400)
RBC: 4.63 MIL/uL (ref 3.87–5.11)
RDW: 14.3 % (ref 11.5–15.5)
WBC: 4.6 10*3/uL (ref 4.0–10.5)

## 2014-04-19 LAB — HEPATITIS B SURFACE ANTIGEN: Hepatitis B Surface Ag: NEGATIVE

## 2014-04-19 LAB — HEPATITIS C ANTIBODY: HCV Ab: NEGATIVE

## 2014-04-19 LAB — HIV ANTIBODY (ROUTINE TESTING W REFLEX): HIV 1&2 Ab, 4th Generation: NONREACTIVE

## 2014-04-19 LAB — RPR

## 2014-04-20 LAB — PAP IG AND HPV HIGH-RISK: HPV DNA High Risk: DETECTED — AB

## 2014-04-21 ENCOUNTER — Other Ambulatory Visit: Payer: Self-pay | Admitting: Certified Nurse Midwife

## 2014-04-21 DIAGNOSIS — B9689 Other specified bacterial agents as the cause of diseases classified elsewhere: Secondary | ICD-10-CM

## 2014-04-21 DIAGNOSIS — N760 Acute vaginitis: Principal | ICD-10-CM

## 2014-04-21 LAB — SURESWAB, VAGINOSIS/VAGINITIS PLUS
ATOPOBIUM VAGINAE: DETECTED Log (cells/mL)
C. ALBICANS, DNA: NOT DETECTED
C. GLABRATA, DNA: NOT DETECTED
C. TRACHOMATIS RNA, TMA: NOT DETECTED
C. TROPICALIS, DNA: NOT DETECTED
C. parapsilosis, DNA: NOT DETECTED
Gardnerella vaginalis: >8 Log (cells/mL)
LACTOBACILLUS SPECIES: NOT DETECTED Log (cells/mL)
N. gonorrhoeae RNA, TMA: NOT DETECTED
T. VAGINALIS RNA, QL TMA: NOT DETECTED

## 2014-04-21 MED ORDER — TINIDAZOLE 500 MG PO TABS: 2.0000 g | ORAL_TABLET | Freq: Every day | ORAL | Status: AC

## 2014-05-10 ENCOUNTER — Telehealth: Payer: Self-pay | Admitting: *Deleted

## 2014-05-10 NOTE — Telephone Encounter (Signed)
Attempted to contact the patient in regards to her colposcopy on May 16, 2014. Provider in surgery so appointment will need to be moved. Also patient's medicaid is showing inactive. Patient must have this fixed before rescheduling the appointment.

## 2014-05-16 ENCOUNTER — Encounter: Payer: Medicaid Other | Admitting: Obstetrics

## 2014-05-24 ENCOUNTER — Other Ambulatory Visit: Payer: Self-pay | Admitting: Obstetrics

## 2014-05-24 ENCOUNTER — Ambulatory Visit (INDEPENDENT_AMBULATORY_CARE_PROVIDER_SITE_OTHER): Payer: Medicaid Other | Admitting: *Deleted

## 2014-05-24 VITALS — BP 121/86 | HR 88 | Temp 98.7°F

## 2014-05-24 DIAGNOSIS — N390 Urinary tract infection, site not specified: Secondary | ICD-10-CM

## 2014-05-24 LAB — POCT URINALYSIS DIPSTICK
Bilirubin, UA: NEGATIVE
Glucose, UA: NEGATIVE
Ketones, UA: NEGATIVE
NITRITE UA: POSITIVE
PH UA: 5
Protein, UA: NEGATIVE
RBC UA: 250
Spec Grav, UA: 1.02
UROBILINOGEN UA: NEGATIVE

## 2014-05-24 MED ORDER — NITROFURANTOIN MONOHYD MACRO 100 MG PO CAPS: 100.0000 mg | ORAL_CAPSULE | Freq: Two times a day (BID) | ORAL | Status: DC

## 2014-05-24 NOTE — Progress Notes (Unsigned)
Pt is in office today for UTI symptoms.  Pt states that she have been having symptoms x 2 days.   UDip in office today confirms UTI.  Urine to be sent for culture.  Pt states that she has no other concerns at today's visit.   Pt was treated for UTI per protocol, Macrobid sent to pharmacy.   Pt was not seen by provider today.   BP 121/86 mmHg  Pulse 88  Temp(Src) 98.7 F (37.1 C)

## 2014-05-27 ENCOUNTER — Other Ambulatory Visit: Payer: Self-pay | Admitting: Obstetrics

## 2014-05-27 LAB — URINE CULTURE

## 2014-05-29 NOTE — Telephone Encounter (Signed)
Contacted patient and rescheduled her colposcopy for 05-30-14.

## 2014-06-06 ENCOUNTER — Other Ambulatory Visit: Payer: Self-pay | Admitting: Obstetrics

## 2014-06-06 ENCOUNTER — Ambulatory Visit (INDEPENDENT_AMBULATORY_CARE_PROVIDER_SITE_OTHER): Payer: Medicaid Other | Admitting: Obstetrics

## 2014-06-06 ENCOUNTER — Encounter: Payer: Self-pay | Admitting: Obstetrics

## 2014-06-06 VITALS — BP 144/105 | HR 104 | Temp 98.8°F | Ht 64.0 in | Wt 145.0 lb

## 2014-06-06 DIAGNOSIS — Z01812 Encounter for preprocedural laboratory examination: Secondary | ICD-10-CM | POA: Diagnosis not present

## 2014-06-06 DIAGNOSIS — R87612 Low grade squamous intraepithelial lesion on cytologic smear of cervix (LGSIL): Secondary | ICD-10-CM

## 2014-06-06 LAB — POCT URINE PREGNANCY: Preg Test, Ur: NEGATIVE

## 2014-06-07 ENCOUNTER — Encounter: Payer: Self-pay | Admitting: Obstetrics

## 2014-06-07 NOTE — Progress Notes (Signed)
Colposcopy Procedure Note  Indications: Pap smear 2 months ago showed: low-grade squamous intraepithelial neoplasia (LGSIL - encompassing HPV,mild dysplasia,CIN I). The prior pap showed no abnormalities.  Prior cervical/vaginal disease: normal exam without visible pathology. Prior cervical treatment: no treatment.  Procedure Details  The risks and benefits of the procedure and Written informed consent obtained.  A time-out was performed confirming the patient, procedure and allergy status  Speculum placed in vagina and excellent visualization of cervix achieved, cervix swabbed x 3 with acetic acid solution.  Findings: Cervix: no visible lesions, no mosaicism, no punctation and no abnormal vasculature; SCJ visualized 360 degrees without lesions, endocervical curettage performed, cervical biopsies taken at 6 and 12 o'clock, specimen labelled and sent to pathology and hemostasis achieved with silver nitrate.   Vaginal inspection: normal without visible lesions. Vulvar colposcopy: vulvar colposcopy not performed.   Physical Exam   Specimens: ECC and cervical biopsies  Complications: none.  Plan: Specimens labelled and sent to Pathology. Treatment options discussed with patient. Post biopsy instructions given to patient. Return to discuss Pathology results in 2 weeks.

## 2014-06-08 LAB — SURESWAB, VAGINOSIS/VAGINITIS PLUS
Atopobium vaginae: NOT DETECTED Log (cells/mL)
BV CATEGORY: UNDETERMINED — AB
C. ALBICANS, DNA: NOT DETECTED
C. TRACHOMATIS RNA, TMA: NOT DETECTED
C. TROPICALIS, DNA: NOT DETECTED
C. glabrata, DNA: NOT DETECTED
C. parapsilosis, DNA: NOT DETECTED
GARDNERELLA VAGINALIS: 6.3 Log (cells/mL)
LACTOBACILLUS SPECIES: 7 Log (cells/mL)
MEGASPHAERA SPECIES: NOT DETECTED Log (cells/mL)
N. GONORRHOEAE RNA, TMA: NOT DETECTED
T. VAGINALIS RNA, QL TMA: NOT DETECTED

## 2014-06-09 ENCOUNTER — Other Ambulatory Visit: Payer: Self-pay | Admitting: Obstetrics

## 2014-06-09 DIAGNOSIS — B9689 Other specified bacterial agents as the cause of diseases classified elsewhere: Secondary | ICD-10-CM

## 2014-06-09 DIAGNOSIS — N760 Acute vaginitis: Principal | ICD-10-CM

## 2014-06-09 MED ORDER — TINIDAZOLE 500 MG PO TABS: 1000.0000 mg | ORAL_TABLET | Freq: Every day | ORAL | Status: DC

## 2014-06-20 ENCOUNTER — Ambulatory Visit: Payer: Medicaid Other | Admitting: Obstetrics

## 2015-09-19 ENCOUNTER — Telehealth: Payer: Self-pay | Admitting: Pulmonary Disease

## 2015-09-19 NOTE — Telephone Encounter (Signed)
Spoke with pt. She was very rude and demanding. She is wanting letter to excuse her from jury duty. Informed pt that since we have not seen her since 01/2013, nor have we filled any medications since 08/2013, that she would have to be seen in order to have letter written. Pt states that she "never came back since Dr Shelle Ironlance just left". I advised her that we have other sleep dr's she can see. Pt states that she "does not want to see another dr because they are strangers". I offered the pt several appt dates and times and she refused them all. Pt states that she needs this letter by 09/24/15, 5 days from now, and wants to know why we won't just write it. I again advised pt that since we have not seen her in over a year that she would need to be seen first. Pt is refusing appt and stating "y'all just don't want to help me". I corrected her and said that since we have not seen her in such a long time, nor has she filled her medications, we have no idea how well managed her narcolepsy is and therefore have no basis to write her a letter stating that this condition prevents her from jury duty. Pt was very argumentative and states that she needs this done as soon as possible and cannot wait for an appt. I asked pt when she received the letter and she states that she "got it while she was on vacation". When I again asked for the date the letter was written she states that "it doesn't matter since you won't help me anyway".   Mellody DanceKeith notified of pt's message.

## 2016-01-17 ENCOUNTER — Emergency Department (HOSPITAL_BASED_OUTPATIENT_CLINIC_OR_DEPARTMENT_OTHER)
Admission: EM | Admit: 2016-01-17 | Discharge: 2016-01-17 | Disposition: A | Discharge status: Home / Self Care | Payer: Medicaid Other | Attending: Emergency Medicine | Admitting: Emergency Medicine

## 2016-01-17 ENCOUNTER — Encounter (HOSPITAL_BASED_OUTPATIENT_CLINIC_OR_DEPARTMENT_OTHER): Payer: Self-pay | Admitting: *Deleted

## 2016-01-17 DIAGNOSIS — R509 Fever, unspecified: Secondary | ICD-10-CM | POA: Diagnosis not present

## 2016-01-17 DIAGNOSIS — R1032 Left lower quadrant pain: Secondary | ICD-10-CM | POA: Insufficient documentation

## 2016-01-17 LAB — COMPREHENSIVE METABOLIC PANEL
ALT: 18 U/L (ref 14–54)
AST: 26 U/L (ref 15–41)
Albumin: 4.2 g/dL (ref 3.5–5.0)
Alkaline Phosphatase: 75 U/L (ref 38–126)
Anion gap: 7 (ref 5–15)
BUN: 8 mg/dL (ref 6–20)
CHLORIDE: 101 mmol/L (ref 101–111)
CO2: 26 mmol/L (ref 22–32)
CREATININE: 0.58 mg/dL (ref 0.44–1.00)
Calcium: 9 mg/dL (ref 8.9–10.3)
GFR calc Af Amer: >60 mL/min (ref >60)
Glucose, Bld: 107 mg/dL — ABNORMAL HIGH (ref 65–99)
POTASSIUM: 3.7 mmol/L (ref 3.5–5.1)
Sodium: 134 mmol/L — ABNORMAL LOW (ref 135–145)
Total Bilirubin: 0.4 mg/dL (ref 0.3–1.2)
Total Protein: 8.2 g/dL — ABNORMAL HIGH (ref 6.5–8.1)

## 2016-01-17 LAB — CBC WITH DIFFERENTIAL/PLATELET
BASOS ABS: 0 10*3/uL (ref 0.0–0.1)
Basophils Relative: 0 %
EOS PCT: 3 %
Eosinophils Absolute: 0.2 10*3/uL (ref 0.0–0.7)
HEMATOCRIT: 38.7 % (ref 36.0–46.0)
Hemoglobin: 12.9 g/dL (ref 12.0–15.0)
LYMPHS ABS: 2.2 10*3/uL (ref 0.7–4.0)
LYMPHS PCT: 41 %
MCH: 30.8 pg (ref 26.0–34.0)
MCHC: 33.3 g/dL (ref 30.0–36.0)
MCV: 92.4 fL (ref 78.0–100.0)
MONO ABS: 0.4 10*3/uL (ref 0.1–1.0)
MONOS PCT: 8 %
Neutro Abs: 2.5 10*3/uL (ref 1.7–7.7)
Neutrophils Relative %: 48 %
PLATELETS: 245 10*3/uL (ref 150–400)
RBC: 4.19 MIL/uL (ref 3.87–5.11)
RDW: 13 % (ref 11.5–15.5)
WBC: 5.3 10*3/uL (ref 4.0–10.5)

## 2016-01-17 LAB — URINALYSIS, ROUTINE W REFLEX MICROSCOPIC
Bilirubin Urine: NEGATIVE
GLUCOSE, UA: NEGATIVE mg/dL
HGB URINE DIPSTICK: NEGATIVE
Ketones, ur: NEGATIVE mg/dL
LEUKOCYTES UA: NEGATIVE
Nitrite: NEGATIVE
PH: 6 (ref 5.0–8.0)
Protein, ur: NEGATIVE mg/dL
SPECIFIC GRAVITY, URINE: 1.012 (ref 1.005–1.030)

## 2016-01-17 LAB — WET PREP, GENITAL
Clue Cells Wet Prep HPF POC: NONE SEEN
SPERM: NONE SEEN
TRICH WET PREP: NONE SEEN
YEAST WET PREP: NONE SEEN

## 2016-01-17 LAB — PREGNANCY, URINE: Preg Test, Ur: NEGATIVE

## 2016-01-17 MED ORDER — HYDROCODONE-ACETAMINOPHEN 5-325 MG PO TABS: 1.0000 | ORAL_TABLET | ORAL | 0 refills | Status: DC | PRN

## 2016-01-17 MED ORDER — IBUPROFEN 600 MG PO TABS: 600.0000 mg | ORAL_TABLET | Freq: Four times a day (QID) | ORAL | 0 refills | Status: DC | PRN

## 2016-01-17 MED ORDER — SODIUM CHLORIDE 0.9 % IV BOLUS (SEPSIS)
1000.0000 mL | Freq: Once | INTRAVENOUS | Status: AC
  Administered 2016-01-17: 1000 mL via INTRAVENOUS

## 2016-01-17 NOTE — ED Provider Notes (Signed)
[facility] Provider Note   CSN: [id number] Arrival date & time: 01/17/16  2059  By signing my name below, I, Alyssa GroveMartin Green, attest that this documentation has been prepared under the direction and in the presence of Jacalyn LefevreJulie Taylen Wendland, MD. Electronically Signed: Alyssa GroveMartin Green, ED Scribe. 01/17/16. 10:30 PM.   History   Chief Complaint Chief Complaint  Patient presents with  . Abdominal Pain   The history is provided by the patient. No language interpreter was used.   HPI Comments: Theresa Foster is a 36 y.o. female who presents to the Emergency Department complaining of gradual onset, constant, moderate LLQ abdominal pain for 2 days. She reports associated low grade fever (99). She was seen at an urgent care earlier today who advised her to be seen in ED due to focalized pain. LNMP on 01/09/16. She denies hx of ovarian cysts. Pt denies any urinary symptoms, vomiting.  Past Medical History:  Diagnosis Date  . Hypersomnia   . Narcolepsy without cataplexy(347.00)   . RLS (restless legs syndrome)     Patient Active Problem List   Diagnosis Date Noted  . Convulsions/seizures (HCC) 01/12/2013  . Vulvar abscess 05/11/2012  . 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 RESTLESS LEGS SYNDROME 02/15/2009  . Narcolepsy without cataplexy(347.00) 02/15/2009    Past Surgical History:  Procedure Laterality Date  . NO PAST SURGERIES      OB History    Gravida Para Term Preterm AB Living   1 1 1     1    SAB TAB Ectopic Multiple Live Births           1       Home Medications    Prior to Admission medications   Medication Sig Start Date End Date Taking? Authorizing Provider  HYDROcodone-acetaminophen (NORCO/VICODIN) 5-325 MG tablet Take 1 tablet by mouth every 4 (four) hours as needed. 01/17/16   Jacalyn LefevreJulie Khadejah Son, MD  ibuprofen (ADVIL,MOTRIN) 600 MG tablet Take 1 tablet (600 mg total) by mouth every 6 (six) hours as needed. 01/17/16   Jacalyn LefevreJulie Daina Cara, MD    MELATONIN PO Take by mouth at bedtime.    Historical Provider, MD  nitrofurantoin, macrocrystal-monohydrate, (MACROBID) 100 MG capsule Take 1 capsule (100 mg total) by mouth 2 (two) times daily. Patient not taking: Reported on 06/06/2014 05/24/14   Brock Badharles A Harper, MD  NUVIGIL 150 MG tablet TAKE 1 TABLET BY MOUTH EVERY MORNING AND TAKE 1 ADDITIONAL TABLET AT 1 PM IF NEEDED 08/24/13   Barbaraann ShareKeith M Clance, MD  pramipexole (MIRAPEX) 0.125 MG tablet Take 1 tablet (0.125 mg total) by mouth at bedtime. 01/12/13   Barbaraann ShareKeith M Clance, MD  tinidazole (TINDAMAX) 500 MG tablet Take 2 tablets (1,000 mg total) by mouth daily with breakfast. 06/09/14   Brock Badharles A Harper, MD    Family History Family History  Problem Relation Age of Onset  . Cancer Maternal Uncle     unsure what kind    Social History Social History  Substance Use Topics  . Smoking status: Never Smoker  . Smokeless tobacco: Never Used  . Alcohol use 0.0 oz/week     Comment: rare     Allergies   Patient has no known allergies.   Review of Systems Review of Systems  Gastrointestinal: Positive for abdominal pain. Negative for vomiting.  Genitourinary: Negative for difficulty urinating, dysuria, frequency, hematuria and urgency.  All other systems reviewed and are negative.  Physical Exam Updated Vital Signs BP 137/90  Pulse 101   Temp 98 F (36.7 C) (Oral)   Resp 18   Ht 5\' 4"  (1.626 m)   Wt 155 lb (70.3 kg)   LMP 01/06/2016   SpO2 100%   BMI 26.61 kg/m   Physical Exam  Constitutional: She is oriented to person, place, and time. She appears well-developed and well-nourished.  HENT:  Head: Normocephalic and atraumatic.  Right Ear: External ear normal.  Left Ear: External ear normal.  Nose: Nose normal.  Mouth/Throat: Oropharynx is clear and moist.  Eyes: Conjunctivae and EOM are normal. Pupils are equal, round, and reactive to light.  Neck: Normal range of motion. Neck supple.  Cardiovascular: Normal rate, regular rhythm,  normal heart sounds and intact distal pulses.   Pulmonary/Chest: Effort normal and breath sounds normal.  Abdominal: Soft. Bowel sounds are normal.  LLQ abdominal pain  Genitourinary: Vagina normal. Left adnexum displays tenderness.  Musculoskeletal: Normal range of motion.  Neurological: She is alert and oriented to person, place, and time.  Skin: Skin is warm and dry.  Psychiatric: She has a normal mood and affect. Her behavior is normal. Judgment and thought content normal.  Nursing note and vitals reviewed.  ED Treatments / Results  DIAGNOSTIC STUDIES: Oxygen Saturation is 100% on RA, normal by my interpretation.    COORDINATION OF CARE: 10:18 PM Discussed treatment plan with pt at bedside which includes Pelvic exam and pt agreed to plan.  Labs (all labs ordered are listed, but only abnormal results are displayed) Labs Reviewed  WET PREP, GENITAL - Abnormal; Notable for the following:       Result Value   WBC, Wet Prep HPF POC FEW (*)    All other components within normal limits  COMPREHENSIVE METABOLIC PANEL - Abnormal; Notable for the following:    Sodium 134 (*)    Glucose, Bld 107 (*)    Total Protein 8.2 (*)    All other components within normal limits  PREGNANCY, URINE  URINALYSIS, ROUTINE W REFLEX MICROSCOPIC  CBC WITH DIFFERENTIAL/PLATELET  GC/CHLAMYDIA PROBE AMP ([place]) NOT AT [facility]    EKG  EKG Interpretation None       Radiology No results found.  Procedures Procedures (including critical care time)  Medications Ordered in ED Medications  sodium chloride 0.9 % bolus 1,000 mL (0 mLs Intravenous Stopped 01/17/16 2324)     Initial Impression / Assessment and Plan / ED Course  I have reviewed the triage vital signs and the nursing notes.  Pertinent labs & imaging results that were available during my care of the patient were reviewed by me and considered in my medical decision making (see chart for details).  Clinical Course     Pt has  tenderness in her left ovarian region.  Her pain is not severe, so I doubt a torsion.  Pt is stable for d/c.  She knows to return if sx worsen.  Final Clinical Impressions(s) / ED Diagnoses   Final diagnoses:  Left lower quadrant pain    New Prescriptions New Prescriptions   HYDROCODONE-ACETAMINOPHEN (NORCO/VICODIN) 5-325 MG TABLET    Take 1 tablet by mouth every 4 (four) hours as needed.   IBUPROFEN (ADVIL,MOTRIN) 600 MG TABLET    Take 1 tablet (600 mg total) by mouth every 6 (six) hours as needed.   I personally performed the services described in this documentation, which was scribed in my presence. The recorded information has been reviewed and is accurate.    Jacalyn Lefevre, MD 01/17/16  2324  

## 2016-01-17 NOTE — ED Triage Notes (Signed)
She went to UC and they could not find the cause of her LLQ pain. She was told to come here for further testing.

## 2016-01-18 LAB — GC/CHLAMYDIA PROBE AMP (~~LOC~~) NOT AT ARMC
Chlamydia: NEGATIVE
NEISSERIA GONORRHEA: NEGATIVE

## 2016-02-07 ENCOUNTER — Encounter: Payer: Self-pay | Admitting: *Deleted

## 2016-02-07 ENCOUNTER — Encounter: Payer: Medicaid Other | Admitting: Family Medicine

## 2016-02-07 NOTE — Progress Notes (Signed)
Theresa Foster missed an appointment for pelvic pain. No need to call patient -may reschedule if she calls.

## 2020-08-09 ENCOUNTER — Other Ambulatory Visit: Payer: Self-pay

## 2020-08-09 ENCOUNTER — Ambulatory Visit
Admission: RE | Admit: 2020-08-09 | Discharge: 2020-08-09 | Disposition: A | Discharge status: Home / Self Care | Payer: Medicaid Other | Source: Ambulatory Visit | Attending: Emergency Medicine | Admitting: Emergency Medicine

## 2020-08-09 VITALS — BP 132/102 | HR 98 | Temp 98.9°F | Ht 64.0 in | Wt 145.0 lb

## 2020-08-09 DIAGNOSIS — N898 Other specified noninflammatory disorders of vagina: Secondary | ICD-10-CM | POA: Diagnosis not present

## 2020-08-09 MED ORDER — FLUCONAZOLE 150 MG PO TABS: 150.0000 mg | ORAL_TABLET | Freq: Once | ORAL | 0 refills | Status: AC

## 2020-08-09 NOTE — ED Triage Notes (Signed)
Patient c/o vaginal itching x 4 days.  The itching comes and goes.  No abnormal discharge, some odor, not a fishy smell.  No OTC meds tried.

## 2020-08-09 NOTE — Discharge Instructions (Addendum)
Vaginal swab pending to screen for causes of vaginal itching I am treating you for a yeast infection Take 1 tab of Diflucan today, may repeat in 72 hours if swab positive and still having symptoms Follow-up if not improving or worsening

## 2020-08-09 NOTE — ED Provider Notes (Signed)
[facility]    CSN: [id number] Arrival date & time: 08/09/20  1006      History   Chief Complaint Chief Complaint  Patient presents with   Vaginal Itching    HPI Theresa Foster is a 40 y.o. female presenting today for evaluation of vaginal itching.  Reports that over the past 3 to 4 days she has had intermittent vaginal itching.  Denies any discharge or odor.  Denies urinary symptoms.  Denies abdominal pain nausea vomiting or fever.  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 Authorizing Provider  Alpha-Lipoic Acid 200 MG CAPS Take by mouth.   Yes [provider]  fluconazole (DIFLUCAN) 150 MG tablet Take 1 tablet (150 mg total) by mouth once for 1 dose. 08/09/20 08/09/20 Yes Margalit Leece C, PA-C  lisdexamfetamine (VYVANSE) 40 MG capsule Take 1 capsule by mouth every morning. 11/08/18  Yes [provider]  Pitolisant HCl (WAKIX) 17.8 MG TABS Take by mouth. 03/07/19  Yes [provider]  pregabalin (LYRICA) 200 MG capsule Take by mouth. 05/02/20  Yes [provider]  tiZANidine (ZANAFLEX) 2 MG tablet 1 in AM and 2 in PM 11/03/18  Yes [provider]    Family History Family History  Problem Relation Age  of Onset   Cancer Maternal Uncle        unsure what kind    Social History Social History   Tobacco Use   Smoking status: Never   Smokeless tobacco: Never  Substance Use Topics   Alcohol use: Yes    Alcohol/week: 0.0 standard drinks    Comment: rare   Drug use: No     Allergies   Dust mite extract, Molds & smuts, Duloxetine, and Escitalopram oxalate   Review of Systems Review of Systems  Constitutional:  Negative for fever.  Respiratory:  Negative for shortness of breath.   Cardiovascular:  Negative for chest pain.  Gastrointestinal:  Negative for abdominal pain, diarrhea, nausea and vomiting.  Genitourinary:  Negative for dysuria, flank pain, genital sores, hematuria, menstrual problem, vaginal bleeding, vaginal discharge and vaginal pain.  Musculoskeletal:  Negative for back pain.  Skin:  Negative for rash.  Neurological:  Negative for dizziness, light-headedness and headaches.    Physical Exam Triage Vital Signs ED Triage Vitals  Enc Vitals Group     BP      Pulse      Resp      Temp  Temp src      SpO2      Weight      Height      Head Circumference      Peak Flow      Pain Score      Pain Loc      Pain Edu?      Excl. in GC?    No data found.  Updated Vital Signs BP (!) 132/102 (BP Location: Right Arm)   Pulse 98   Temp 98.9 F (37.2 C) (Oral)   Ht 5\' 4"  (1.626 m)   Wt 145 lb (65.8 kg)   LMP 07/19/2020   SpO2 98%   BMI 24.89 kg/m   Visual Acuity Right Eye Distance:   Left Eye Distance:   Bilateral Distance:    Right Eye Near:   Left Eye Near:    Bilateral Near:     Physical Exam Vitals and nursing note reviewed.  Constitutional:      Appearance: She is well-developed.     Comments: No acute distress  HENT:     Head: Normocephalic and atraumatic.     Nose: Nose normal.  Eyes:     Conjunctiva/sclera: Conjunctivae normal.  Cardiovascular:     Rate and Rhythm: Normal rate.  Pulmonary:     Effort: Pulmonary effort is  normal. No respiratory distress.  Abdominal:     General: There is no distension.  Musculoskeletal:        General: Normal range of motion.     Cervical back: Neck supple.  Skin:    General: Skin is warm and dry.  Neurological:     Mental Status: She is alert and oriented to person, place, and time.     UC Treatments / Results  Labs (all labs ordered are listed, but only abnormal results are displayed) Labs Reviewed  CERVICOVAGINAL ANCILLARY ONLY    EKG   Radiology No results found.  Procedures Procedures (including critical care time)  Medications Ordered in UC Medications - No data to display  Initial Impression / Assessment and Plan / UC Course  I have reviewed the triage vital signs and the nursing notes.  Pertinent labs & imaging results that were available during my care of the patient were reviewed by me and considered in my medical decision making (see chart for details).     Vaginal itching-swab pending to further evaluate for causes of itching, empirically treating today for yeast with Diflucan, will call with results of swab and alter therapy as needed.  Discussed strict return precautions. Patient verbalized understanding and is agreeable with plan.  Final Clinical Impressions(s) / UC Diagnoses   Final diagnoses:  Vaginal itching     Discharge Instructions      Vaginal swab pending to screen for causes of vaginal itching I am treating you for a yeast infection Take 1 tab of Diflucan today, may repeat in 72 hours if swab positive and still having symptoms Follow-up if not improving or worsening     ED Prescriptions     Medication Sig Dispense Auth. Provider   fluconazole (DIFLUCAN) 150 MG tablet Take 1 tablet (150 mg total) by mouth once for 1 dose. 2 tablet Cheri Ayotte, Beltrami C, PA-C      PDMP not reviewed this encounter.   Villa Rodriguez, PA-C 08/09/20 1346

## 2020-08-10 LAB — CERVICOVAGINAL ANCILLARY ONLY
Bacterial Vaginitis (gardnerella): POSITIVE — AB
Candida Glabrata: NEGATIVE
Candida Vaginitis: NEGATIVE
Chlamydia: NEGATIVE
Comment: NEGATIVE
Comment: NEGATIVE
Comment: NEGATIVE
Comment: NEGATIVE
Comment: NEGATIVE
Comment: NORMAL
Neisseria Gonorrhea: NEGATIVE
Trichomonas: NEGATIVE

## 2020-08-13 ENCOUNTER — Telehealth (HOSPITAL_COMMUNITY): Payer: Self-pay | Admitting: Emergency Medicine

## 2020-08-13 MED ORDER — METRONIDAZOLE 500 MG PO TABS: 500.0000 mg | ORAL_TABLET | Freq: Two times a day (BID) | ORAL | 0 refills | Status: DC

## 2020-11-08 ENCOUNTER — Other Ambulatory Visit: Payer: Self-pay

## 2020-11-08 ENCOUNTER — Ambulatory Visit
Admission: EM | Admit: 2020-11-08 | Discharge: 2020-11-08 | Disposition: A | Discharge status: Home / Self Care | Payer: Medicaid Other | Attending: Emergency Medicine | Admitting: Emergency Medicine

## 2020-11-08 DIAGNOSIS — R0989 Other specified symptoms and signs involving the circulatory and respiratory systems: Secondary | ICD-10-CM | POA: Diagnosis present

## 2020-11-08 DIAGNOSIS — B9689 Other specified bacterial agents as the cause of diseases classified elsewhere: Secondary | ICD-10-CM | POA: Diagnosis not present

## 2020-11-08 DIAGNOSIS — J351 Hypertrophy of tonsils: Secondary | ICD-10-CM | POA: Insufficient documentation

## 2020-11-08 DIAGNOSIS — J038 Acute tonsillitis due to other specified organisms: Secondary | ICD-10-CM | POA: Insufficient documentation

## 2020-11-08 DIAGNOSIS — R1310 Dysphagia, unspecified: Secondary | ICD-10-CM | POA: Diagnosis not present

## 2020-11-08 DIAGNOSIS — J358 Other chronic diseases of tonsils and adenoids: Secondary | ICD-10-CM | POA: Insufficient documentation

## 2020-11-08 LAB — POCT RAPID STREP A (OFFICE): Rapid Strep A Screen: NEGATIVE

## 2020-11-08 MED ORDER — PREDNISONE 10 MG PO TABS: ORAL_TABLET | ORAL | 0 refills | Status: AC

## 2020-11-08 MED ORDER — CEFDINIR 300 MG PO CAPS: 300.0000 mg | ORAL_CAPSULE | Freq: Two times a day (BID) | ORAL | 0 refills | Status: AC

## 2020-11-08 NOTE — ED Provider Notes (Signed)
[facility]    CSN: [id number] Arrival date & time: 11/08/20  0919      History   Chief Complaint No chief complaint on file.   HPI Theresa Foster is a 40 y.o. female.   Pt reports feeling like something is in her throat when she swallows. Patient states this morning she was intermittently having difficulty breathing. Patient does not display s/s of respiratory distress at this time.  Patient denies fever, aches, chills, headache, nausea, vomiting, diarrhea, body ache, sinus pressure, nasal congestion.  Patient states has not tried any medications for this, decided to come in and get checked out because she has no idea what this is, states she is never had ideally this before  The history is provided by the patient.   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 Authorizing Provider  cefdinir (OMNICEF) 300 MG capsule Take 1 capsule (300 mg total) by mouth 2 (two) times daily for 10 days. 11/08/20 11/18/20 Yes Theadora Rama Scales, PA-C  Pitolisant HCl [facility]) 4.45 MG TABS Take 1 tablet by mouth daily. 05/02/20  Yes [provider]  predniSONE (DELTASONE) 10 MG tablet Take 4 tablets (40 mg total) by mouth daily for 1 day, THEN 3 tablets (30 mg  total) daily for 1 day, THEN 2 tablets (20 mg total) daily for 1 day, THEN 1 tablet (10 mg total) daily for 1 day, THEN 0.5 tablets (5 mg total) daily for 1 day. 11/08/20 11/13/20 Yes Theadora Rama Scales, PA-C  Alpha-Lipoic Acid 200 MG CAPS Take by mouth.    [provider]  lisdexamfetamine (VYVANSE) 40 MG capsule Take 1 capsule by mouth every morning. 11/08/18   [provider]  Pitolisant HCl (WAKIX) 17.8 MG TABS Take by mouth. 03/07/19   [provider]  pregabalin (LYRICA) 200 MG capsule Take by mouth. 05/02/20   [provider]  tiZANidine (ZANAFLEX) 2 MG tablet 1 in AM and 2 in PM 11/03/18   [provider]    Family History Family History  Problem Relation Age of Onset   Cancer Maternal Uncle        unsure what kind    Social History Social History   Tobacco Use   Smoking status: Never   Smokeless tobacco: Never  Substance Use Topics   Alcohol use: Yes    Alcohol/week: 0.0 standard drinks    Comment: rare   Drug use: No     Allergies  Dust mite extract, Molds & smuts, Duloxetine, and Escitalopram oxalate   Review of Systems Review of Systems Pertinent findings noted in history of present illness.    Physical Exam Triage Vital Signs ED Triage Vitals  Enc Vitals Group     BP 11/02/20 0827 (!) 147/82     Pulse Rate 11/02/20 0827 72     Resp 11/02/20 0827 18     Temp 11/02/20 0827 98.3 F (36.8 C)     Temp Source 11/02/20 0827 Oral     SpO2 11/02/20 0827 98 %     Weight --      Height --      Head Circumference --      Peak Flow --      Pain Score 11/02/20 0826 5     Pain Loc --      Pain Edu? --      Excl. in GC? --    No data found.  Updated Vital Signs BP (!) 127/93 (BP Location: Right Arm)   Pulse 95   Temp 99 F (37.2 C) (Oral)   Resp 18   LMP 11/04/2020   SpO2 97%   Visual Acuity Right Eye Distance:   Left Eye Distance:   Bilateral Distance:    Right Eye Near:   Left Eye Near:    Bilateral  Near:     Physical Exam Vitals and nursing note reviewed.  Constitutional:      General: She is not in acute distress.    Appearance: Normal appearance. She is not ill-appearing.  HENT:     Head: Normocephalic and atraumatic.     Salivary Glands: Right salivary gland is not diffusely enlarged or tender. Left salivary gland is not diffusely enlarged or tender.     Right Ear: Tympanic membrane, ear canal and external ear normal. No drainage. No middle ear effusion. There is no impacted cerumen. Tympanic membrane is not erythematous or bulging.     Left Ear: Tympanic membrane, ear canal and external ear normal. No drainage.  No middle ear effusion. There is no impacted cerumen. Tympanic membrane is not erythematous or bulging.     Nose: Nose normal. No nasal deformity, septal deviation, mucosal edema, congestion or rhinorrhea.     Right Turbinates: Not enlarged, swollen or pale.     Left Turbinates: Not enlarged, swollen or pale.     Right Sinus: No maxillary sinus tenderness or frontal sinus tenderness.     Left Sinus: No maxillary sinus tenderness or frontal sinus tenderness.     Mouth/Throat:     Lips: Pink. No lesions.     Mouth: Mucous membranes are moist. No oral lesions.     Pharynx: Uvula midline. Pharyngeal swelling and posterior oropharyngeal erythema present. No uvula swelling.     Tonsils: Tonsillar exudate present. 4+ on the right. 4+ on the left.  Eyes:     General: Lids are normal.        Right eye: No discharge.        Left eye: No discharge.     Extraocular Movements: Extraocular movements intact.     Conjunctiva/sclera: Conjunctivae normal.     Right eye: Right conjunctiva is not injected.     Left eye: Left conjunctiva is not injected.  Neck:     Trachea: Trachea and phonation normal.  Cardiovascular:     Rate and Rhythm: Normal rate and regular rhythm.     Pulses: Normal pulses.     Heart sounds:  Normal heart sounds. No murmur heard.   No friction rub. No gallop.   Pulmonary:     Effort: Pulmonary effort is normal. No accessory muscle usage, prolonged expiration or respiratory distress.     Breath sounds: Normal breath sounds. No stridor, decreased air movement or transmitted upper airway sounds. No decreased breath sounds, wheezing, rhonchi or rales.  Chest:     Chest wall: No tenderness.  Musculoskeletal:        General: Normal range of motion.     Cervical back: Normal range of motion and neck supple. Normal range of motion.  Lymphadenopathy:     Cervical: Cervical adenopathy present.     Right cervical: Posterior cervical adenopathy present.     Left cervical: Posterior cervical adenopathy present.  Skin:    General: Skin is warm and dry.     Findings: No erythema or rash.  Neurological:     General: No focal deficit present.     Mental Status: She is alert and oriented to person, place, and time.  Psychiatric:        Mood and Affect: Mood normal.        Behavior: Behavior normal.     UC Treatments / Results  Labs (all labs ordered are listed, but only abnormal results are displayed) Labs Reviewed  CULTURE, GROUP A STREP [facility])  POCT RAPID STREP A (OFFICE)    EKG   Radiology No results found.  Procedures Procedures (including critical care time)  Medications Ordered in UC Medications - No data to display  Initial Impression / Assessment and Plan / UC Course  I have reviewed the triage vital signs and the nursing notes.  Pertinent labs & imaging results that were available during my care of the patient were reviewed by me and considered in my medical decision making (see chart for details).     Patient is nontoxic in appearance on arrival however she has significantly enlarged tonsils, both with erythema and exudate.  Rapid strep test today was negative.  Throat culture will be sent per protocol.  Patient advised that I would like for her to begin taking cefdinir for presumed bacterial pharyngitis and that should she be  advised that her group A strep for culture was negative she should continue the entire course.  I have also prescribed 5 days of prednisone to address her globus sensation and intermittent difficulty breathing and swallowing.  Patient advised to follow-up in 4 days if she does not have significant improvement.  Patient advised to go to the emergency room for worsening dysphagia or difficulty maintaining her airway.  Patient verbalized understanding and agreement of plan as discussed.  All questions were addressed during visit.  Please see discharge instructions below for further details of plan.  Final Clinical Impressions(s) / UC Diagnoses   Final diagnoses:  Dysphagia, unspecified type  Globus sensation  Enlarged tonsils  Tonsillar exudate  Acute bacterial tonsillitis     Discharge Instructions      As we discussed, I believe that you have a bacterial infection in both of your tonsils and this is creating a sensation of fullness in the back of your throat as well as difficulty swallowing and occasional difficulty maintaining her airway.  For this, I recommend that you begin taking a 5-day course of prednisone today.  I also recommend that we treat you empirically for presumed bacterial tonsillitis with an antibiotic called cefdinir, please take 1 tablet twice daily for the next 10 days.  Your  rapid strep test today was Positive, throat culture will not need to be performed.  You should finish all 10 days of cefdinir.  If you do not have significant improvement of your symptoms in the next 3-4 days, or worsening of your symptoms while on these 2 medications, please go to the emergency room for more emergent evaluation and treatment.  Thank you for visiting urgent care today.  I know that your time is valuable and I appreciate you waiting.     ED Prescriptions     Medication Sig Dispense Auth. Provider   cefdinir (OMNICEF) 300 MG capsule Take 1 capsule (300 mg total) by mouth 2 (two)  times daily for 10 days. 20 capsule Theadora Rama Scales, PA-C   predniSONE (DELTASONE) 10 MG tablet Take 4 tablets (40 mg total) by mouth daily for 1 day, THEN 3 tablets (30 mg total) daily for 1 day, THEN 2 tablets (20 mg total) daily for 1 day, THEN 1 tablet (10 mg total) daily for 1 day, THEN 0.5 tablets (5 mg total) daily for 1 day. 11 tablet Theadora Rama Scales, PA-C      PDMP not reviewed this encounter.    Theadora Rama Scales, PA-C 11/08/20 1533

## 2020-11-08 NOTE — Discharge Instructions (Addendum)
As we discussed, I believe that you have a bacterial infection in both of your tonsils and this is creating a sensation of fullness in the back of your throat as well as difficulty swallowing and occasional difficulty maintaining her airway.  For this, I recommend that you begin taking a 5-day course of prednisone today.  I also recommend that we treat you empirically for presumed bacterial tonsillitis with an antibiotic called cefdinir, please take 1 tablet twice daily for the next 10 days.  Your rapid strep test today was Positive, throat culture will not need to be performed.  You should finish all 10 days of cefdinir.  If you do not have significant improvement of your symptoms in the next 3-4 days, or worsening of your symptoms while on these 2 medications, please go to the emergency room for more emergent evaluation and treatment.  Thank you for visiting urgent care today.  I know that your time is valuable and I appreciate you waiting.

## 2020-11-08 NOTE — ED Triage Notes (Signed)
Pt reports feeling like something is in her throat when she swallows. Patient states this morning she was having difficulty breathing. Patient does not display s/s of respiratory distress at this time.  Started: today

## 2020-11-11 LAB — CULTURE, GROUP A STREP (THRC)

## 2022-04-30 ENCOUNTER — Ambulatory Visit
Admission: EM | Admit: 2022-04-30 | Discharge: 2022-04-30 | Disposition: A | Discharge status: Home / Self Care | Payer: Medicaid Other | Attending: Nurse Practitioner | Admitting: Nurse Practitioner

## 2022-04-30 DIAGNOSIS — N898 Other specified noninflammatory disorders of vagina: Secondary | ICD-10-CM | POA: Diagnosis present

## 2022-04-30 DIAGNOSIS — Z113 Encounter for screening for infections with a predominantly sexual mode of transmission: Secondary | ICD-10-CM | POA: Diagnosis present

## 2022-04-30 LAB — POCT URINALYSIS DIP (MANUAL ENTRY)
Bilirubin, UA: NEGATIVE
Blood, UA: NEGATIVE
Glucose, UA: NEGATIVE mg/dL
Ketones, POC UA: NEGATIVE mg/dL
Leukocytes, UA: NEGATIVE
Nitrite, UA: NEGATIVE
Protein Ur, POC: NEGATIVE mg/dL
Spec Grav, UA: 1.025 (ref 1.010–1.025)
Urobilinogen, UA: 0.2 E.U./dL
pH, UA: 6.5 (ref 5.0–8.0)

## 2022-04-30 LAB — POCT URINE PREGNANCY: Preg Test, Ur: NEGATIVE

## 2022-04-30 NOTE — Discharge Instructions (Signed)
The clinical contact you with results of the testing done today Follow-up with your PCP if your symptoms unimproved Please go to the ER for any worsening symptoms

## 2022-04-30 NOTE — ED Provider Notes (Signed)
[facility]    CSN: [id number] Arrival date & time: 04/30/22  1028      History   Chief Complaint Chief Complaint  Patient presents with   vaginal irritation    HPI Theresa Foster is a 42 y.o. female presents for evaluation of vaginal irritation.  Patient reports 3 days of some irritation with voiding.  She denies any burning with urination, hematuria, urgency, frequency, nausea/vomiting, flank pain.  No vaginal discharge.  No STD exposure but she would like screening while here.  States symptoms did start after she changed soaps.  She states the outer part of her vagina feels irritated but she denies any rashes, redness, swelling.  She has not taken any OTC medications for symptoms.  No other concerns at this time.  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 Authorizing Provider  Alpha-Lipoic Acid 200 MG CAPS Take by mouth.    [provider]  lisdexamfetamine (VYVANSE) 40 MG capsule Take 1 capsule by mouth every morning. 11/08/18   [provider]  Pitolisant HCl (WAKIX) 17.8 MG TABS Take by mouth. 03/07/19   [provider]  Pitolisant HCl (WAKIX) 4.45 MG TABS Take 1 tablet by mouth daily. 05/02/20   [provider]   pregabalin (LYRICA) 200 MG capsule Take by mouth. 05/02/20   [provider]  tiZANidine (ZANAFLEX) 2 MG tablet 1 in AM and 2 in PM 11/03/18   [provider]    Family History Family History  Problem Relation Age of Onset   Cancer Maternal Uncle        unsure what kind    Social History Social History   Tobacco Use   Smoking status: Never   Smokeless tobacco: Never  Substance Use Topics   Alcohol use: Yes    Alcohol/week: 0.0 standard drinks of alcohol    Comment: rare   Drug use: No     Allergies   Dust mite extract, Molds & smuts, Duloxetine, and Escitalopram oxalate   Review of Systems Review of Systems  Genitourinary:        Vaginal irritation      Physical Exam Triage Vital Signs ED Triage Vitals  Enc Vitals Group     BP 04/30/22 1135 (!) 132/98     Pulse Rate 04/30/22 1135 100     Resp 04/30/22 1135 16     Temp 04/30/22 1135 99 F (37.2 C)     Temp Source  04/30/22 1135 Oral     SpO2 04/30/22 1135 97 %     Weight --      Height --      Head Circumference --      Peak Flow --      Pain Score 04/30/22 1133 0     Pain Loc --      Pain Edu? --      Excl. in GC? --    No data found.  Updated Vital Signs BP (!) 132/98 (BP Location: Right Arm)   Pulse 100   Temp 99 F (37.2 C) (Oral)   Resp 16   LMP 04/22/2022 (Exact Date)   SpO2 97%   Visual Acuity Right Eye Distance:   Left Eye Distance:   Bilateral Distance:    Right Eye Near:   Left Eye Near:    Bilateral Near:     Physical Exam Vitals and nursing note reviewed.  Constitutional:      Appearance: Normal appearance.  HENT:     Head: Normocephalic and atraumatic.  Eyes:     Pupils: Pupils are equal, round, and reactive to light.  Cardiovascular:     Rate and Rhythm: Normal rate and regular rhythm.     Heart sounds: Normal heart sounds.  Pulmonary:     Effort: Pulmonary effort is normal.     Breath sounds: Normal breath sounds.  Skin:    General: Skin is warm  and dry.  Neurological:     General: No focal deficit present.     Mental Status: She is alert and oriented to person, place, and time.  Psychiatric:        Mood and Affect: Mood normal.        Behavior: Behavior normal.      UC Treatments / Results  Labs (all labs ordered are listed, but only abnormal results are displayed) Labs Reviewed  URINE CULTURE  RPR  HIV ANTIBODY (ROUTINE TESTING W REFLEX)  POCT URINALYSIS DIP (MANUAL ENTRY)  POCT URINE PREGNANCY  CERVICOVAGINAL ANCILLARY ONLY    EKG   Radiology No results found.  Procedures Procedures (including critical care time)  Medications Ordered in UC Medications - No data to display  Initial Impression / Assessment and Plan / UC Course  I have reviewed the triage vital signs and the nursing notes.  Pertinent labs & imaging results that were available during my care of the patient were reviewed by me and considered in my medical decision making (see chart for details).     UA unremarkable.  Will culture given symptoms. STD testing is ordered and we will contact for any positive results Patient encouraged to continue with fragrance free soaps, wearing cotton underwear, and hygiene reviewed Follow-up with PCP if symptoms do not improve ER precautions reviewed and patient verbalized understanding Final Clinical Impressions(s) / UC Diagnoses   Final diagnoses:  Vaginal irritation  Screening examination for STD (sexually transmitted disease)     Discharge Instructions      The clinical contact you with results of the testing done today Follow-up with your PCP if your symptoms unimproved Please go to the ER for any worsening symptoms    ED Prescriptions   None    PDMP not reviewed this encounter.   Radford Pax, NP 04/30/22 1200

## 2022-04-30 NOTE — ED Triage Notes (Signed)
Pt presents with c/o irritation when voiding X 3 days.  States she has felt worse today.

## 2022-05-01 ENCOUNTER — Telehealth (HOSPITAL_COMMUNITY): Payer: Self-pay | Admitting: Emergency Medicine

## 2022-05-01 LAB — CERVICOVAGINAL ANCILLARY ONLY
Bacterial Vaginitis (gardnerella): POSITIVE — AB
Candida Glabrata: NEGATIVE
Candida Vaginitis: NEGATIVE
Chlamydia: NEGATIVE
Comment: NEGATIVE
Comment: NEGATIVE
Comment: NEGATIVE
Comment: NEGATIVE
Comment: NEGATIVE
Comment: NORMAL
Neisseria Gonorrhea: NEGATIVE
Trichomonas: NEGATIVE

## 2022-05-01 LAB — URINE CULTURE: Culture: <10000 — AB

## 2022-05-01 LAB — RPR: RPR Ser Ql: NONREACTIVE

## 2022-05-01 LAB — HIV ANTIBODY (ROUTINE TESTING W REFLEX): HIV Screen 4th Generation wRfx: NONREACTIVE

## 2022-05-01 MED ORDER — METRONIDAZOLE 500 MG PO TABS: 500.0000 mg | ORAL_TABLET | Freq: Two times a day (BID) | ORAL | 0 refills | Status: DC

## 2022-05-18 ENCOUNTER — Ambulatory Visit
Admission: EM | Admit: 2022-05-18 | Discharge: 2022-05-18 | Disposition: A | Discharge status: Home / Self Care | Payer: Medicaid Other | Attending: Family Medicine | Admitting: Family Medicine

## 2022-05-18 DIAGNOSIS — N760 Acute vaginitis: Secondary | ICD-10-CM | POA: Diagnosis not present

## 2022-05-18 LAB — POCT URINALYSIS DIP (MANUAL ENTRY)
Bilirubin, UA: NEGATIVE
Glucose, UA: NEGATIVE mg/dL
Ketones, POC UA: NEGATIVE mg/dL
Nitrite, UA: NEGATIVE
Protein Ur, POC: NEGATIVE mg/dL
Spec Grav, UA: >=1.03 — AB (ref 1.010–1.025)
Urobilinogen, UA: 0.2 E.U./dL
pH, UA: 6.5 (ref 5.0–8.0)

## 2022-05-18 MED ORDER — FLUCONAZOLE 150 MG PO TABS: 150.0000 mg | ORAL_TABLET | ORAL | 0 refills | Status: AC

## 2022-05-18 NOTE — ED Provider Notes (Signed)
[facility]    CSN: [id number] Arrival date & time: 05/18/22  1136      History   Chief Complaint Chief Complaint  Patient presents with   Vaginal Itching    HPI Theresa Foster is a 42 y.o. female.    Vaginal Itching   Here for vaginal and perineal itching.  It began in the last 3 to 4 days.  On April 24 she was seen in had self swab done of the vaginal area.  It was positive for bacterial vaginosis.  She waited to start taking the metronidazole until about 10 to 14 days ago, as she had had a function to go to that she wanted to have a serving of alcohol and so did not want to mix that with the metronidazole.  What she did take it she took that twice a day for 7 days as prescribed.  She is saying that she is having a little bit of white discharge and a good bit of perineal itching.  Only occasionally is she having some burning when the urine hits the rash.  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 Authorizing Provider  fluconazole (DIFLUCAN) 150 MG tablet Take 1 tablet (150 mg total) by mouth every 3 (three) days for 2 doses. 05/18/22 05/22/22 Yes Zenia Resides, MD   Alpha-Lipoic Acid 200 MG CAPS Take by mouth.    [provider]  lisdexamfetamine (VYVANSE) 40 MG capsule Take 1 capsule by mouth every morning. 11/08/18   [provider]  Pitolisant HCl (WAKIX) 17.8 MG TABS Take by mouth. 03/07/19   [provider]  Pitolisant HCl (WAKIX) 4.45 MG TABS Take 1 tablet by mouth daily. 05/02/20   [provider]  pregabalin (LYRICA) 200 MG capsule Take by mouth. 05/02/20   [provider]  tiZANidine (ZANAFLEX) 2 MG tablet 1 in AM and 2 in PM 11/03/18   [provider]    Family History Family History  Problem Relation Age of Onset   Cancer Maternal Uncle        unsure what kind    Social History Social History   Tobacco Use   Smoking status: Never   Smokeless tobacco: Never  Vaping Use   Vaping Use: Never used  Substance Use Topics   Alcohol use: Yes    Alcohol/week: 0.0 standard drinks of alcohol  Comment: rare   Drug use: No     Allergies   Dust mite extract, Molds & smuts, Duloxetine, and Escitalopram oxalate   Review of Systems Review of Systems   Physical Exam Triage Vital Signs ED Triage Vitals  Enc Vitals Group     BP 05/18/22 1145 (!) 138/98     Pulse Rate 05/18/22 1145 100     Resp 05/18/22 1145 16     Temp 05/18/22 1145 99.2 F (37.3 C)     Temp Source 05/18/22 1145 Oral     SpO2 05/18/22 1145 97 %     Weight --      Height --      Head Circumference --      Peak Flow --      Pain Score 05/18/22 1148 0     Pain Loc --      Pain Edu? --      Excl. in GC? --    No data found.  Updated Vital Signs BP (!) 138/98 (BP Location: Right Arm)   Pulse 100   Temp 99.2 F (37.3 C) (Oral)   Resp 16   LMP 04/22/2022 (Exact Date)   SpO2 97%   Visual Acuity Right Eye Distance:   Left Eye Distance:   Bilateral Distance:    Right Eye Near:   Left Eye Near:    Bilateral Near:     Physical Exam Vitals reviewed.  Constitutional:      General: She is not in acute  distress.    Appearance: She is not ill-appearing, toxic-appearing or diaphoretic.  Cardiovascular:     Rate and Rhythm: Normal rate and regular rhythm.  Pulmonary:     Effort: Pulmonary effort is normal.     Breath sounds: Normal breath sounds.  Abdominal:     Palpations: Abdomen is soft.     Tenderness: There is no abdominal tenderness.  Neurological:     Mental Status: She is alert and oriented to person, place, and time.  Psychiatric:        Behavior: Behavior normal.      UC Treatments / Results  Labs (all labs ordered are listed, but only abnormal results are displayed) Labs Reviewed  POCT URINALYSIS DIP (MANUAL ENTRY) - Abnormal; Notable for the following components:      Result Value   Spec Grav, UA >=1.030 (*)    Blood, UA trace-intact (*)    Leukocytes, UA Small (1+) (*)    All other components within normal limits  CERVICOVAGINAL ANCILLARY ONLY    EKG   Radiology No results found.  Procedures Procedures (including critical care time)  Medications Ordered in UC Medications - No data to display  Initial Impression / Assessment and Plan / UC Course  I have reviewed the triage vital signs and the nursing notes.  Pertinent labs & imaging results that were available during my care of the patient were reviewed by me and considered in my medical decision making (see chart for details).       Urinalysis shows small amount of leukocytes, and I think this is consistent with her vaginal discharge.  Symptoms are consistent with candidiasis, so fluconazole is sent in empirically.  Vaginal self swab was done again, and we will notify her of any positives and treat per protocol. Final Clinical Impressions(s) / UC Diagnoses   Final diagnoses:  Acute vaginitis     Discharge Instructions      Take fluconazole 150 mg--1 tablet every 3  days for 2 doses  Staff will notify you if there is anything positive on the swab     ED Prescriptions     Medication Sig  Dispense Auth. Provider   fluconazole (DIFLUCAN) 150 MG tablet Take 1 tablet (150 mg total) by mouth every 3 (three) days for 2 doses. 2 tablet Marlinda Mike Janace Aris, MD      PDMP not reviewed this encounter.   Zenia Resides, MD 05/18/22 [phone]

## 2022-05-18 NOTE — ED Triage Notes (Signed)
Pt reports vaginal itching, white vaginal discharge  and burning when urinating  x 2-3 days.

## 2022-05-18 NOTE — Discharge Instructions (Signed)
Take fluconazole 150 mg--1 tablet every 3 days for 2 doses  Staff will notify you if there is anything positive on the swab

## 2022-05-19 LAB — CERVICOVAGINAL ANCILLARY ONLY
Bacterial Vaginitis (gardnerella): POSITIVE — AB
Candida Glabrata: NEGATIVE
Candida Vaginitis: POSITIVE — AB
Chlamydia: NEGATIVE
Comment: NEGATIVE
Comment: NEGATIVE
Comment: NEGATIVE
Comment: NEGATIVE
Comment: NEGATIVE
Comment: NORMAL
Neisseria Gonorrhea: NEGATIVE
Trichomonas: NEGATIVE

## 2022-05-20 ENCOUNTER — Telehealth (HOSPITAL_COMMUNITY): Payer: Self-pay | Admitting: Emergency Medicine

## 2022-05-20 MED ORDER — METRONIDAZOLE 0.75 % VA GEL: 1.0000 | Freq: Every day | VAGINAL | 0 refills | Status: AC

## 2022-05-22 ENCOUNTER — Encounter (HOSPITAL_COMMUNITY): Payer: Self-pay | Admitting: Emergency Medicine

## 2022-05-22 ENCOUNTER — Telehealth (HOSPITAL_COMMUNITY): Payer: Self-pay | Admitting: Emergency Medicine

## 2022-05-22 ENCOUNTER — Telehealth: Payer: Self-pay | Admitting: Urgent Care

## 2022-05-22 MED ORDER — CLINDAMYCIN HCL 300 MG PO CAPS: 300.0000 mg | ORAL_CAPSULE | Freq: Two times a day (BID) | ORAL | 0 refills | Status: DC

## 2022-05-22 MED ORDER — METRONIDAZOLE 500 MG PO TABS: 500.0000 mg | ORAL_TABLET | Freq: Two times a day (BID) | ORAL | 0 refills | Status: DC

## 2022-05-22 NOTE — Telephone Encounter (Signed)
Patient is request an alternative to metronidazole.  Was prescribed this for bacterial vaginosis but states that she is allergic.  Will switch her to clindamycin.

## 2023-12-23 ENCOUNTER — Inpatient Hospital Stay: Admission: RE | Admit: 2023-12-23 | Discharge: 2023-12-23

## 2023-12-23 VITALS — BP 133/95 | HR 96 | Temp 98.8°F | Resp 16

## 2023-12-23 DIAGNOSIS — Z113 Encounter for screening for infections with a predominantly sexual mode of transmission: Secondary | ICD-10-CM | POA: Diagnosis present

## 2023-12-23 DIAGNOSIS — R300 Dysuria: Secondary | ICD-10-CM | POA: Insufficient documentation

## 2023-12-23 LAB — POCT URINE DIPSTICK
Bilirubin, UA: NEGATIVE
Blood, UA: NEGATIVE
Glucose, UA: NEGATIVE mg/dL
Ketones, POC UA: NEGATIVE mg/dL
Leukocytes, UA: NEGATIVE
Nitrite, UA: NEGATIVE
POC PROTEIN,UA: NEGATIVE
Spec Grav, UA: 1.025 (ref 1.010–1.025)
Urobilinogen, UA: 0.2 U/dL
pH, UA: 6 (ref 5.0–8.0)

## 2023-12-23 LAB — POCT URINE PREGNANCY: Preg Test, Ur: NEGATIVE

## 2023-12-23 NOTE — Discharge Instructions (Addendum)
The clinical contact you with results of the testing done today if positive.  Follow-up with your PCP as needed

## 2023-12-23 NOTE — ED Provider Notes (Signed)
 [facility]    CSN: [id number] Arrival date & time: 12/23/23  0930      History   Chief Complaint Chief Complaint  Patient presents with   Dysuria    HPI Theresa Foster is a 43 y.o. female presents for dysuria and STD testing.  Patient reports 2 days of burning with urination without urgency, frequency, hematuria, fevers, vomiting or flank pain.  No vaginal discharge or STD concern but would like screening.  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 Authorizing Provider  Alpha-Lipoic Acid 200 MG CAPS Take by mouth.    [provider]  clindamycin  (CLEOCIN ) 300 MG capsule Take 1 capsule (300 mg total) by mouth 2 (two) times daily. 05/22/22   Christopher Savannah, PA-C  lisdexamfetamine (VYVANSE) 40 MG capsule Take 1 capsule by mouth every morning. 11/08/18   [provider]  Pitolisant HCl (WAKIX) 17.8 MG TABS Take by mouth. 03/07/19   [provider]  Pitolisant HCl (WAKIX) 4.45 MG TABS Take 1 tablet by mouth daily. 05/02/20   [provider]  pregabalin (LYRICA) 200 MG capsule Take by mouth. 05/02/20   [provider]  tiZANidine (ZANAFLEX) 2 MG tablet 1 in AM and 2 in PM 11/03/18    [provider]    Family History Family History  Problem Relation Age of Onset   Cancer Maternal Uncle        unsure what kind    Social History Social History[1]   Allergies   Dust mite extract, Molds & smuts, Duloxetine, Metrogel  [metronidazole ], and Escitalopram oxalate   Review of Systems Review of Systems  Genitourinary:  Positive for dysuria.     Physical Exam Triage Vital Signs ED Triage Vitals  Encounter Vitals Group     BP 12/23/23 1009 (!) 133/95     Girls Systolic BP Percentile --      Girls Diastolic BP Percentile --      Boys Systolic BP Percentile --      Boys Diastolic BP Percentile --      Pulse Rate 12/23/23 1009 96     Resp 12/23/23 1009 16     Temp 12/23/23 1009 98.8 F (37.1 C)     Temp Source 12/23/23 1009 Oral     SpO2 12/23/23 1009 98 %     Weight --      Height --  Head Circumference --      Peak Flow --      Pain Score 12/23/23 1006 0     Pain Loc --      Pain Education --      Exclude from Growth Chart --    No data found.  Updated Vital Signs BP (!) 133/95 (BP Location: Right Arm)   Pulse 96   Temp 98.8 F (37.1 C) (Oral)   Resp 16   LMP 11/29/2023 (Exact Date)   SpO2 98%   Visual Acuity Right Eye Distance:   Left Eye Distance:   Bilateral Distance:    Right Eye Near:   Left Eye Near:    Bilateral Near:     Physical Exam Vitals and nursing note reviewed.  Constitutional:      Appearance: Normal appearance.  HENT:     Head: Normocephalic and atraumatic.  Eyes:     Pupils: Pupils are equal, round, and reactive to light.  Cardiovascular:     Rate and Rhythm: Normal rate.  Pulmonary:     Effort: Pulmonary effort is normal.  Skin:    General: Skin is warm and dry.  Neurological:     General: No focal deficit present.     Mental Status: She is alert and oriented to person, place, and time.  Psychiatric:        Mood and Affect: Mood normal.        Behavior: Behavior normal.      UC Treatments  / Results  Labs (all labs ordered are listed, but only abnormal results are displayed) Labs Reviewed  SYPHILIS: RPR W/REFLEX TO RPR TITER AND TREPONEMAL ANTIBODIES, TRADITIONAL SCREENING AND DIAGNOSIS ALGORITHM  HIV ANTIBODY (ROUTINE TESTING W REFLEX)  POCT URINE PREGNANCY  POCT URINE DIPSTICK  CERVICOVAGINAL ANCILLARY ONLY    EKG   Radiology No results found.  Procedures Procedures (including critical care time)  Medications Ordered in UC Medications - No data to display  Initial Impression / Assessment and Plan / UC Course  I have reviewed the triage vital signs and the nursing notes.  Pertinent labs & imaging results that were available during my care of the patient were reviewed by me and considered in my medical decision making (see chart for details).     UA and urine hCG negative.  STD testing as ordered and will contact for any positive results.  Patient to follow-up with PCP as needed Final Clinical Impressions(s) / UC Diagnoses   Final diagnoses:  Dysuria  Screening examination for STD (sexually transmitted disease)     Discharge Instructions      The clinical contact you with results of the testing done today if positive.  Follow-up with your PCP as needed.    ED Prescriptions   None    PDMP not reviewed this encounter.    [1]  Social History Tobacco Use   Smoking status: Never   Smokeless tobacco: Never  Vaping Use   Vaping status: Never Used  Substance Use Topics   Alcohol use: Yes    Alcohol/week: 0.0 standard drinks of alcohol    Comment: rare   Drug use: No     Loreda Myla SAUNDERS, NP 12/23/23 1031

## 2023-12-23 NOTE — ED Triage Notes (Signed)
 Pt states burning with urination for the past for the past 2 days.  Also states she would like to be tested for STD's.

## 2023-12-24 LAB — HIV ANTIBODY (ROUTINE TESTING W REFLEX): HIV Screen 4th Generation wRfx: NONREACTIVE

## 2023-12-24 LAB — CERVICOVAGINAL ANCILLARY ONLY
Chlamydia: NEGATIVE
Comment: NEGATIVE
Comment: NEGATIVE
Comment: NORMAL
Neisseria Gonorrhea: NEGATIVE
Trichomonas: NEGATIVE

## 2023-12-24 LAB — SYPHILIS: RPR W/REFLEX TO RPR TITER AND TREPONEMAL ANTIBODIES, TRADITIONAL SCREENING AND DIAGNOSIS ALGORITHM: RPR Ser Ql: NONREACTIVE

## 2024-01-28 ENCOUNTER — Other Ambulatory Visit (HOSPITAL_COMMUNITY)
Admission: RE | Admit: 2024-01-28 | Discharge: 2024-01-28 | Disposition: A | Source: Ambulatory Visit | Attending: Family Medicine | Admitting: Family Medicine

## 2024-01-28 ENCOUNTER — Ambulatory Visit: Payer: Self-pay | Admitting: Family Medicine

## 2024-01-28 ENCOUNTER — Encounter: Payer: Self-pay | Admitting: Family Medicine

## 2024-01-28 VITALS — BP 126/94 | HR 95 | Ht 64.0 in | Wt 148.4 lb

## 2024-01-28 DIAGNOSIS — L509 Urticaria, unspecified: Secondary | ICD-10-CM

## 2024-01-28 DIAGNOSIS — F32A Depression, unspecified: Secondary | ICD-10-CM

## 2024-01-28 DIAGNOSIS — Z113 Encounter for screening for infections with a predominantly sexual mode of transmission: Secondary | ICD-10-CM | POA: Insufficient documentation

## 2024-01-28 DIAGNOSIS — I10 Essential (primary) hypertension: Secondary | ICD-10-CM

## 2024-01-28 DIAGNOSIS — Z01419 Encounter for gynecological examination (general) (routine) without abnormal findings: Secondary | ICD-10-CM | POA: Diagnosis present

## 2024-01-28 DIAGNOSIS — Z1231 Encounter for screening mammogram for malignant neoplasm of breast: Secondary | ICD-10-CM

## 2024-01-28 DIAGNOSIS — Z124 Encounter for screening for malignant neoplasm of cervix: Secondary | ICD-10-CM

## 2024-01-28 NOTE — Progress Notes (Signed)
 "  ANNUAL EXAM Patient name: Theresa Foster MRN [id number]  Date of birth: [birth year] Chief Complaint:   No chief complaint on file.  History of Present Illness:   Theresa Foster is a 43 y.o. [id number] African-American female being seen today for a routine annual exam.  Current complaints: hives with no identified trigger and wanting referral to allergist. Vaginal irritation when using toilet paper.  Patient's last menstrual period was 01/16/2024 (exact date).   The pregnancy intention screening data noted above was reviewed. Potential methods of contraception were discussed. The patient elected to proceed with No data recorded.   Last pap 2019. Results were: NILM w/ HRHPV negative. H/O abnormal pap: yes - 04/18/2014 HPV DNA high risk detected Last mammogram: never. Family h/o breast cancer: yes - half sister Last colonoscopy: n/a. Family h/o colorectal cancer: no     01/28/2024    1:16 PM  Depression screen PHQ 2/9  Decreased Interest 1  Down, Depressed, Hopeless 1  PHQ - 2 Score 2  Altered sleeping 3  Tired, decreased energy 1  Change in appetite 3  Feeling bad or failure about yourself  1  Trouble concentrating 3  Moving slowly or fidgety/restless 0  Suicidal thoughts 0  PHQ-9 Score 13        01/28/2024    1:17 PM  GAD 7 : Generalized Anxiety Score  Nervous, Anxious, on Edge 0  Control/stop worrying 1  Worry too much - different things 1  Trouble relaxing 0  Restless 0  Easily annoyed or irritable 0  Afraid - awful might happen 0  Total GAD 7 Score 2     Review of Systems:   Pertinent items are noted in HPI Denies any headaches, blurred vision, fatigue, shortness of breath, chest pain, abdominal pain, abnormal vaginal discharge/itching/odor/irritation, problems with periods, bowel movements, urination, or intercourse unless otherwise stated above.  Pertinent History Reviewed:  Reviewed past medical,surgical, social and family history.  Reviewed problem list,  medications and allergies.  Physical Assessment:   Vitals:   01/28/24 1305 01/28/24 1323  BP: (!) 124/96 (!) 126/94  Pulse: (!) 102 95  Weight: 148 lb 6.4 oz (67.3 kg)   Height: 5' 4 (1.626 m)    Body mass index is 25.47 kg/m.   Physical Examination:  General appearance - well appearing, and in no distress Mental status - alert, oriented to person, place, and time Psych:  She has a normal mood and affect Skin - warm and dry, normal color, no suspicious lesions noted Chest - effort normal, no problems with respiration noted Heart - normal rate and regular rhythm Neck:  midline trachea, no thyromegaly or nodules Breasts - breasts appear normal, no suspicious masses, no skin or nipple changes or  axillary nodes Abdomen - soft, nontender, nondistended, no masses or organomegaly Pelvic - VULVA: normal appearing vulva with no masses, tenderness or lesions   VAGINA: normal appearing vagina with normal color and discharge, no lesions   CERVIX: normal appearing cervix without discharge or lesions, no CMT Thin prep pap is done with HR HPV cotesting Extremities:  No swelling or varicosities noted  Chaperone present for exam  No results found for this or any previous visit (from the past 24 hours).  Assessment & Plan:  1. Encounter for well woman exam (Primary) - Will follow up results of pap smear and manage accordingly. - Mammogram: schedule screening mammo as soon as possible, ordered today - Colonoscopy: @ 44yo, or sooner if problems - Routine preventative  health maintenance measures emphasized. - Please refer to After Visit Summary for other counseling recommendations.  - Amb ref to Integrated [facility] - Cervicovaginal ancillary only( [facility]) - Hepatitis B Surface AntiGEN - Hepatitis C Antibody - HIV antibody (with reflex) - RPR  2. Screening for cervical cancer - Cytology - PAP  3. Screening mammogram for breast cancer - MM 3D SCREENING MAMMOGRAM BILATERAL  BREAST; Future  4. Depression, unspecified depression type 13 on PHQ-9, open to referral for counseling. Denies SI and HI. - Amb ref to [facility]  5. Screen for STD (sexually transmitted disease) - Cervicovaginal ancillary only( [facility]) - Hepatitis B Surface AntiGEN - Hepatitis C Antibody - HIV antibody (with reflex) - RPR  6. Primary hypertension Elevated BP today and on 12/23/2023, meeting diagnostic criteria for HTN. Patient reports she believes this is due to her seizure medication. Discussed lifestyle modifications including DASH diet to reduce BP. Follow up with PCP in 6-8 weeks to assess how BP responds to lifestyle changes and discuss need for medication. Referral to primary care placed.      Orders Placed This Encounter  Procedures   MM 3D SCREENING MAMMOGRAM BILATERAL BREAST   Hepatitis B Surface AntiGEN   Hepatitis C Antibody   HIV antibody (with reflex)   RPR   Amb ref to Integrated [facility]   Ambulatory Referral to Primary Care   Ambulatory referral to Allergy    Meds: No orders of the defined types were placed in this encounter.  Follow-up: Return in about 1 year (around 01/27/2025) for [facility].  Joesph DELENA Sear, PA  "

## 2024-01-28 NOTE — Progress Notes (Signed)
 PAP 04/18/2014 HPV DNA high risk detected. Pt reports last PAP was about 4 years ago; normal results 4 years ago.   Pt scored 13 on PHQ; she is open to counselor. Referral placed.  BP in office today 124/96 and 126/94  Pt has never had a mammogram.

## 2024-01-28 NOTE — Patient Instructions (Signed)
 VAGINAL HEALTH: Keep your vaginal area dry. Towel-dry after every bath or shower. Change out of wet clothes promptly after swimming or working out. Wash your pubic area gently with warm water. Soap is not needed on your vulva and should never be used in your vagina. Use unscented soap on your legs and vulva. Wear cotton underwear. Cotton lets your vaginal area breathe more than synthetic fabrics. Use condoms during sex. This can prevent new organisms from entering your vagina. Clean reusable products like menstrual cups, diaphragms, cervical caps, and spermicide applicators after every use. Take probiotics or eat more yogurt. These can help boost the levels of healthy bacteria in your body. Look for yogurts that contain live and active cultures. Do not douche. Douching can throw off the balance of organisms in your vagina, allowing unhealthy bacteria and fungi to grow. Remember, your vagina is self-cleaning! Seek preventive treatment when taking antibiotics. Some people tend to get yeast infections while taking antibiotics for other types of infection. If this sounds like you, ask your ob-gyn about taking a preventive treatment at the same time. An antifungal medication can counteract the antibiotics effect on your vaginal health.

## 2024-01-29 ENCOUNTER — Ambulatory Visit: Admission: RE | Admit: 2024-01-29 | Source: Ambulatory Visit

## 2024-01-29 ENCOUNTER — Ambulatory Visit: Payer: Self-pay | Admitting: Family Medicine

## 2024-01-29 DIAGNOSIS — Z1231 Encounter for screening mammogram for malignant neoplasm of breast: Secondary | ICD-10-CM

## 2024-01-29 LAB — HEPATITIS B SURFACE ANTIGEN: Hepatitis B Surface Ag: NEGATIVE

## 2024-01-29 LAB — CERVICOVAGINAL ANCILLARY ONLY
Bacterial Vaginitis (gardnerella): NEGATIVE
Candida Glabrata: NEGATIVE
Candida Vaginitis: NEGATIVE
Chlamydia: NEGATIVE
Comment: NEGATIVE
Comment: NEGATIVE
Comment: NEGATIVE
Comment: NEGATIVE
Comment: NEGATIVE
Comment: NORMAL
Neisseria Gonorrhea: NEGATIVE
Trichomonas: NEGATIVE

## 2024-01-29 LAB — HEPATITIS C ANTIBODY: Hep C Virus Ab: NONREACTIVE

## 2024-01-29 LAB — HIV ANTIBODY (ROUTINE TESTING W REFLEX): HIV Screen 4th Generation wRfx: NONREACTIVE

## 2024-01-29 LAB — CYTOLOGY - PAP
Adequacy: ABSENT
Comment: NEGATIVE
Diagnosis: NEGATIVE
High risk HPV: NEGATIVE

## 2024-01-29 LAB — SYPHILIS: RPR W/REFLEX TO RPR TITER AND TREPONEMAL ANTIBODIES, TRADITIONAL SCREENING AND DIAGNOSIS ALGORITHM: RPR Ser Ql: NONREACTIVE

## 2024-03-09 ENCOUNTER — Ambulatory Visit: Payer: Self-pay | Admitting: Allergy
